# Patient Record
Sex: Male | Born: 1960
Health system: Southern US, Community
[De-identification: ages and names within clinical notes are randomized; demographics above are authoritative.]

## PROBLEM LIST (undated history)

## (undated) DIAGNOSIS — G4730 Sleep apnea, unspecified: Secondary | ICD-10-CM

## (undated) DIAGNOSIS — D369 Benign neoplasm, unspecified site: Secondary | ICD-10-CM

## (undated) DIAGNOSIS — G4733 Obstructive sleep apnea (adult) (pediatric): Secondary | ICD-10-CM

## (undated) DIAGNOSIS — E7800 Pure hypercholesterolemia, unspecified: Secondary | ICD-10-CM

## (undated) DIAGNOSIS — K219 Gastro-esophageal reflux disease without esophagitis: Secondary | ICD-10-CM

## (undated) HISTORY — PX: POLYPECTOMY: SHX149

## (undated) HISTORY — DX: Obstructive sleep apnea (adult) (pediatric): G47.33

## (undated) HISTORY — PX: COLONOSCOPY: SHX174

## (undated) HISTORY — PX: MOHS SURGERY: SHX181

## (undated) HISTORY — DX: Sleep apnea, unspecified: G47.30

## (undated) HISTORY — DX: Pure hypercholesterolemia, unspecified: E78.00

## (undated) HISTORY — PX: ESOPHAGEAL DILATION: SHX303

## (undated) HISTORY — PX: INGUINAL HERNIA REPAIR: SUR1180

## (undated) HISTORY — DX: Benign neoplasm, unspecified site: D36.9

## (undated) HISTORY — DX: Gastro-esophageal reflux disease without esophagitis: K21.9

---

## 2000-01-02 ENCOUNTER — Emergency Department (HOSPITAL_COMMUNITY): Admission: EM | Admit: 2000-01-02 | Discharge: 2000-01-02 | Payer: Self-pay | Admitting: *Deleted

## 2006-10-18 ENCOUNTER — Ambulatory Visit (HOSPITAL_COMMUNITY): Admission: RE | Admit: 2006-10-18 | Discharge: 2006-10-18 | Payer: Self-pay | Admitting: *Deleted

## 2007-03-02 ENCOUNTER — Ambulatory Visit (HOSPITAL_BASED_OUTPATIENT_CLINIC_OR_DEPARTMENT_OTHER): Admission: RE | Admit: 2007-03-02 | Discharge: 2007-03-02 | Payer: Self-pay | Admitting: Family Medicine

## 2007-03-04 ENCOUNTER — Ambulatory Visit: Payer: Self-pay | Admitting: Internal Medicine

## 2010-11-10 NOTE — Procedures (Signed)
NAME:  Norman Dunn, Norman Dunn NO.:  [id number]   MEDICAL RECORD NO.:  [id number]          PATIENT TYPE:  OUT   LOCATION:  SLEEP CENTER                 FACILITY:  [facility]   PHYSICIAN:  Clinton D. Maple Hudson, MD, FCCP, FACPDATE OF BIRTH:  1960/09/22   DATE OF STUDY:  03/02/2007                            NOCTURNAL POLYSOMNOGRAM   REFERRING PHYSICIAN:  Tammy R. Collins Scotland, M.D.   INDICATIONS FOR PROCEDURE:  Hypersomnia with sleep apnea.   RESULTS:  Epward sleepiness score 8/24; BMI 28.8. Weight 202 pounds. No  home medications are listed.   RESPIRATORY DATA:  Total sleep time 372 minutes with sleep efficiency  90%. Stage 1 was 6%. Stage 2, 75%. Stage 3 absent. REM 19% of total  sleep time. Sleep latency 11 minutes. REM latency 75 minutes. Awake  after sleep onset, 28 minutes. Arousal index 10.8. No bedtime medication  was taken.   RESPIRATORY DATA:  Split study protocol. Apnea hypopnea index (AHI RDI)  38.6 obstructive events per hour, indicating moderate obstructive sleep  apnea/hypopnea syndrome before CPAP. There were 9 obstructive apnea's  and 119 hypopnea's before CPAP. All sleep and all events were while  supine. REM AHI 49.5. CPAP was titrated to 14 CWP, AHI zero per hour. A  large ResMed Quattro mask was chosen. The patient's tolerance was poor.   OXYGEN DATA:  Moderate snoring with oxygen desaturation nadir 82%. After  CPAP control, saturation held 95% to 98% on room air.   CARDIAC DATA:  Normal sinus rhythm.   MOVEMENT/PARASOMNIA:  No significant limb jerk or movement disturbance.   IMPRESSION/RECOMMENDATIONS:  1. Moderate obstructive sleep apnea/hypopnea syndrome, AHI 38.6 per      hour with all sleep and events while supine. Moderate snoring with      oxygen desaturation nadir 82%.  2. Successful CPAP titration to 14 CWP. AHI zero per hour. A large      ResMed Quattro mask was chosen with heated humidifier. Technician      report patient did not like CPAP,  considering in cumbersome. He may      be able      to adjust successfully with time and some support and education.      Otherwise, may need to consider alternative therapies, although      CPAP did work well during this study.      Clinton D. Maple Hudson, MD, [facility], FACP  Diplomate, Biomedical engineer of Sleep Medicine  Electronically Signed     CDY/MEDQ  D:  03/05/2007 09:55:47  T:  03/05/2007 17:12:42  Job:  [id number]

## 2010-11-13 NOTE — Op Note (Signed)
NAME:  Norman Dunn, Norman Dunn                 ACCOUNT NO.:  [id number]   MEDICAL RECORD NO.:  [id number]          PATIENT TYPE:  AMB   LOCATION:  DAY                          FACILITY:  [facility]   PHYSICIAN:  Alfonse Ras, MD   DATE OF BIRTH:  [birth year]   DATE OF PROCEDURE:  10/18/2006  DATE OF DISCHARGE:                               OPERATIVE REPORT   PREOPERATIVE DIAGNOSIS:  Right inguinal hernia.   POSTOPERATIVE DIAGNOSIS:  Right inguinal hernia.   PROCEDURE:  Right inguinal hernia repair with mesh.   ANESTHESIA:  General, laryngeal mask.   SURGEON:  Alfonse Ras, M.D.   DESCRIPTION:  The patient was taken to the operating room and placed in  the supine position. After adequate general anesthesia was induced using  laryngeal mask, the abdomen and perineum were prepped and draped in a  normal sterile fashion. Using an oblique incision over the inguinal  canal, I dissected down onto the external oblique fascia.  This was  opened along its fibers down to the pubic tubercle.  The ilioinguinal  nerve was retracted superiorly.  The spermatic cord was surrounded at  the pubic tubercle.  A Penrose drain was placed.  A direct hernia defect  was identified and reduced.  On skeletonizing the cord, a small indirect  hernia defect was identified, as well, dissected off the cord, and  ligated at the internal ring.  Primary repair was performed of the floor  of Hesselbach's triangle by approximating the shelving edge of Cooper's  ligament and inguinal ligament to the transversalis fascia.  A piece of  Atrium mesh was then placed over this and sutured using a running 2-0  Prolene from the pubic tubercle along the transversalis fascia along the  inguinal ligament.  It was split and brought out lateral to the internal  ring and tacked laterally, as well.  Adequate hemostasis was insured.  The tissues were injected with 0.5% Marcaine.  The external oblique  fascia was closed with running 3-0  Vicryl.  The skin was closed with  staples.  The patient tolerated the procedure well and went to PACU in  good condition.      Alfonse Ras, MD  Electronically Signed     KRE/MEDQ  D:  10/18/2006  T:  10/18/2006  Job:  [id number]

## 2010-11-15 ENCOUNTER — Emergency Department (HOSPITAL_COMMUNITY)
Admission: EM | Admit: 2010-11-15 | Discharge: 2010-11-15 | Disposition: A | Discharge status: Home / Self Care | Payer: Managed Care, Other (non HMO) | Attending: Emergency Medicine | Admitting: Emergency Medicine

## 2010-11-15 ENCOUNTER — Emergency Department (HOSPITAL_COMMUNITY): Payer: Managed Care, Other (non HMO)

## 2010-11-15 DIAGNOSIS — IMO0002 Reserved for concepts with insufficient information to code with codable children: Secondary | ICD-10-CM | POA: Insufficient documentation

## 2010-11-15 DIAGNOSIS — S022XXA Fracture of nasal bones, initial encounter for closed fracture: Secondary | ICD-10-CM | POA: Insufficient documentation

## 2010-11-15 DIAGNOSIS — S0083XA Contusion of other part of head, initial encounter: Secondary | ICD-10-CM | POA: Insufficient documentation

## 2010-11-15 DIAGNOSIS — S0003XA Contusion of scalp, initial encounter: Secondary | ICD-10-CM | POA: Insufficient documentation

## 2010-11-15 DIAGNOSIS — S139XXA Sprain of joints and ligaments of unspecified parts of neck, initial encounter: Secondary | ICD-10-CM | POA: Insufficient documentation

## 2012-01-13 ENCOUNTER — Encounter: Payer: Self-pay | Admitting: Gastroenterology

## 2012-02-08 ENCOUNTER — Ambulatory Visit (AMBULATORY_SURGERY_CENTER): Payer: Managed Care, Other (non HMO)

## 2012-02-08 VITALS — Ht 70.0 in | Wt 206.5 lb

## 2012-02-08 DIAGNOSIS — Z1211 Encounter for screening for malignant neoplasm of colon: Secondary | ICD-10-CM

## 2012-02-08 MED ORDER — MOVIPREP 100 G PO SOLR: ORAL | Status: DC

## 2012-02-09 ENCOUNTER — Encounter: Payer: Self-pay | Admitting: Gastroenterology

## 2012-02-23 ENCOUNTER — Ambulatory Visit (AMBULATORY_SURGERY_CENTER): Payer: Managed Care, Other (non HMO) | Admitting: Gastroenterology

## 2012-02-23 ENCOUNTER — Encounter: Payer: Self-pay | Admitting: Gastroenterology

## 2012-02-23 VITALS — BP 122/74 | HR 54 | Temp 98.0°F | Resp 18 | Ht 70.0 in | Wt 206.0 lb

## 2012-02-23 DIAGNOSIS — Z1211 Encounter for screening for malignant neoplasm of colon: Secondary | ICD-10-CM

## 2012-02-23 DIAGNOSIS — D126 Benign neoplasm of colon, unspecified: Secondary | ICD-10-CM

## 2012-02-23 DIAGNOSIS — K5730 Diverticulosis of large intestine without perforation or abscess without bleeding: Secondary | ICD-10-CM

## 2012-02-23 MED ORDER — SODIUM CHLORIDE 0.9 % IV SOLN: 500.0000 mL | INTRAVENOUS | Status: DC

## 2012-02-23 NOTE — Progress Notes (Signed)
Bradycardic baseline 40's-50's, during scope insertion hr down to 30's

## 2012-02-23 NOTE — Patient Instructions (Signed)
Colon polyps x2 removed and diverticulosis seen. See handouts given. Repeat colonoscopy in 6 months, result letter in mail in 1-2 weeks. Hold aspirin, aspirin products,and anti-inflammatory medications for 2 weeks(until 03/08/12). Resume current medications. Call us with any questions or concerns. Thank you!!  YOU HAD AN ENDOSCOPIC PROCEDURE TODAY AT THE [facility]: Refer to the procedure report that was given to you for any specific questions about what was found during the examination.  If the procedure report does not answer your questions, please call your gastroenterologist to clarify.  If you requested that your care partner not be given the details of your procedure findings, then the procedure report has been included in a sealed envelope for you to review at your convenience later.  YOU SHOULD EXPECT: Some feelings of bloating in the abdomen. Passage of more gas than usual.  Walking can help get rid of the air that was put into your GI tract during the procedure and reduce the bloating. If you had a lower endoscopy (such as a colonoscopy or flexible sigmoidoscopy) you may notice spotting of blood in your stool or on the toilet paper. If you underwent a bowel prep for your procedure, then you may not have a normal bowel movement for a few days.  DIET: Your first meal following the procedure should be a light meal and then it is ok to progress to your normal diet.  A half-sandwich or bowl of soup is an example of a good first meal.  Heavy or fried foods are harder to digest and may make you feel nauseous or bloated.  Likewise meals heavy in dairy and vegetables can cause extra gas to form and this can also increase the bloating.  Drink plenty of fluids but you should avoid alcoholic beverages for 24 hours.  ACTIVITY: Your care partner should take you home directly after the procedure.  You should plan to take it easy, moving slowly for the rest of the day.  You can resume normal activity  the day after the procedure however you should NOT DRIVE or use heavy machinery for 24 hours (because of the sedation medicines used during the test).    SYMPTOMS TO REPORT IMMEDIATELY: A gastroenterologist can be reached at any hour.  During normal business hours, 8:30 AM to 5:00 PM Monday through Friday, call [phone].  After hours and on weekends, please call the GI answering service at [phone] who will take a message and have the physician on call contact you.   Following lower endoscopy (colonoscopy or flexible sigmoidoscopy):  Excessive amounts of blood in the stool  Significant tenderness or worsening of abdominal pains  Swelling of the abdomen that is new, acute  Fever of 100F or higher  FOLLOW UP: If any biopsies were taken you will be contacted by phone or by letter within the next 1-3 weeks.  Call your gastroenterologist if you have not heard about the biopsies in 3 weeks.  Our staff will call the home number listed on your records the next business day following your procedure to check on you and address any questions or concerns that you may have at that time regarding the information given to you following your procedure. This is a courtesy call and so if there is no answer at the home number and we have not heard from you through the emergency physician on call, we will assume that you have returned to your regular daily activities without incident.  SIGNATURES/CONFIDENTIALITY: You and/or your  care partner have signed paperwork which will be entered into your electronic medical record.  These signatures attest to the fact that that the information above on your After Visit Summary has been reviewed and is understood.  Full responsibility of the confidentiality of this discharge information lies with you and/or your care-partner.

## 2012-02-23 NOTE — Progress Notes (Signed)
Patient did not experience any of the following events: a burn prior to discharge; a fall within the facility; wrong site/side/patient/procedure/implant event; or a hospital transfer or hospital admission upon discharge from the facility. (G8907) Patient did not have preoperative order for IV antibiotic SSI prophylaxis. (G8918)  

## 2012-02-23 NOTE — Op Note (Signed)
[facility] [address] [place], [address]   COLONOSCOPY PROCEDURE REPORT PATIENT: Norman Dunn, Norman Dunn  MR#: [id number] BIRTHDATE: January 18, 1961 , 51  yrs. old GENDER: Male ENDOSCOPIST: Rachael Fee, MD REFERRED XB:JYNWG Spear, M.D. PROCEDURE DATE:  02/23/2012 PROCEDURE:   Colonoscopy with snare polypectomy ASA CLASS:   Class II INDICATIONS:average risk screening. MEDICATIONS: Fentanyl 50 mcg IV, Versed 6 mg IV, and These medications were titrated to patient response per physician's verbal order DESCRIPTION OF PROCEDURE:   After the risks benefits and alternatives of the procedure were thoroughly explained, informed consent was obtained.  A digital rectal exam revealed no rectal mass.   The LB PCF-H180AL [id number]  endoscope was introduced through the anus and advanced to the cecum, which was identified by both the appendix and ileocecal valve. No adverse events experienced. The quality of the prep was good, using MoviPrep  The instrument was then slowly withdrawn as the colon was fully examined.   COLON FINDINGS: Two sessile polyps were found, removed and sent to pathology.  One waas 13mm across, located on fold at splenic flexure, removed in piecemeal fashion using snare/cautery and cold snare as well.  This was sent to path (jar 1).  The other was in descending colon, 3mm across, removed with cold snare, sent to pathology (jar 2).   Mild diverticulosis was noted The finding was in the left colon.   The colon mucosa was otherwise normal. Retroflexed views revealed no abnormalities. The time to cecum=3 minutes 46 seconds.  Withdrawal time=17 minutes 43 seconds.  The scope was withdrawn and the procedure completed. COMPLICATIONS: There were no complications.  ENDOSCOPIC IMPRESSION: 1.   Two polyps found, removed and sent to pathology (one was removed in piecemeal fashion)). 2.   Mild diverticulosis was noted in the left colon 3.   The colon mucosa was otherwise  normal  RECOMMENDATIONS: If the polyp(s) removed today are proven to be adenomatous (pre-cancerous) polyps, you will need a colonoscopy in 6 months given piecemeal resection.  Otherwise you should continue to follow colorectal cancer screening guidelines for "routine risk" patients with a colonoscopy in 10 years.  You will receive a letter within 1-2 weeks with the results of your biopsy as well as final recommendations.  Please call my office if you have not received a letter after 3 weeks.    eSigned:  Rachael Fee, MD 02/23/2012 10:42 AM

## 2012-02-24 ENCOUNTER — Telehealth: Payer: Self-pay | Admitting: *Deleted

## 2012-02-24 NOTE — Telephone Encounter (Signed)
  Follow up Call-  Call back number 02/23/2012  Post procedure Call Back phone  # [phone]  Permission to leave phone message Yes     Patient questions:  Do you have a fever, pain , or abdominal swelling? no Pain Score  0 *  Have you tolerated food without any problems? yes  Have you been able to return to your normal activities? yes  Do you have any questions about your discharge instructions: Diet   no Medications  no Follow up visit  no  Do you have questions or concerns about your Care? no  Actions: * If pain score is 4 or above: No action needed, pain <4.

## 2012-03-04 ENCOUNTER — Encounter: Payer: Self-pay | Admitting: Gastroenterology

## 2012-03-06 ENCOUNTER — Telehealth: Payer: Self-pay | Admitting: Gastroenterology

## 2012-03-06 NOTE — Telephone Encounter (Signed)
Left message on machine to call back regarding letter about biopsy result

## 2012-03-07 NOTE — Telephone Encounter (Signed)
Pt has been given the biopsy results and was advised that the letter with all this info has been mailed

## 2012-03-09 ENCOUNTER — Telehealth: Payer: Self-pay | Admitting: Gastroenterology

## 2012-03-09 NOTE — Telephone Encounter (Signed)
Pt aware letter has been mailed.  

## 2012-07-17 ENCOUNTER — Encounter: Payer: Self-pay | Admitting: Gastroenterology

## 2012-08-14 ENCOUNTER — Encounter: Payer: Self-pay | Admitting: Gastroenterology

## 2012-09-04 ENCOUNTER — Ambulatory Visit (AMBULATORY_SURGERY_CENTER): Payer: Managed Care, Other (non HMO) | Admitting: *Deleted

## 2012-09-04 VITALS — Ht 70.5 in | Wt 204.0 lb

## 2012-09-04 DIAGNOSIS — Z86010 Personal history of colonic polyps: Secondary | ICD-10-CM

## 2012-09-04 DIAGNOSIS — Z1211 Encounter for screening for malignant neoplasm of colon: Secondary | ICD-10-CM

## 2012-09-04 MED ORDER — NA SULFATE-K SULFATE-MG SULF 17.5-3.13-1.6 GM/177ML PO SOLN: ORAL | Status: DC

## 2012-09-05 ENCOUNTER — Encounter: Payer: Self-pay | Admitting: Gastroenterology

## 2012-09-07 ENCOUNTER — Encounter: Payer: Self-pay | Admitting: Pulmonary Disease

## 2012-09-07 ENCOUNTER — Ambulatory Visit (INDEPENDENT_AMBULATORY_CARE_PROVIDER_SITE_OTHER): Payer: Managed Care, Other (non HMO) | Admitting: Pulmonary Disease

## 2012-09-07 VITALS — BP 120/82 | HR 67 | Temp 97.4°F | Ht 70.0 in | Wt 202.0 lb

## 2012-09-07 DIAGNOSIS — G4733 Obstructive sleep apnea (adult) (pediatric): Secondary | ICD-10-CM

## 2012-09-07 NOTE — Patient Instructions (Signed)
Will have [facility] adjust your CPAP settings Follow up in 4 weeks

## 2012-09-07 NOTE — Assessment & Plan Note (Signed)
He has history of severe OSA.  He has history of hypertension.  He continues to have sleep disruption.  His main problem has been adjusting to CPAP pressure setting.  I have reviewed his sleep test results with the patient.  Explained how sleep apnea can affect the patient's health.  Driving precautions and importance of weight loss were discussed.  Treatment options for sleep apnea were reviewed.  Will have his DME change his CPAP to auto setting with range of 5 to 15 cm H2O, and review his download after this change.  Explained he may need repeat sleep study and possibly in lab titration study.  If he is unable to tolerate CPAP, then he may be a good candidate for an oral appliance.

## 2012-09-07 NOTE — Progress Notes (Signed)
Chief Complaint  Patient presents with  . Advice Only    had sleep study 2008. has CPAP. will occasionally use it. feels like pressure is to strong    History of Present Illness: Norman Dunn is a 52 y.o. male for evaluation of sleep apnea.  He has trouble with snoring, and his wife reported that he would stop breathing while asleep.  As a result he had a sleep study in 2008.  This showed severe sleep apnea.  He was started on CPAP 14 cm H2O.  He does not think his pressure setting has been changed since.  He continues to try using CPAP, but he has difficulty tolerating pressure from machine.  He has both a full face mask and hybrid mask >> these fit well.    He goes to bed at 1130 pm.  He falls asleep quickly.  He is a restless sleeper.  His wife has to nudge him to breath.  He gets out of bed at 6 am.  He feels okay in the morning, but has to take a 30 minute nap in the evening.  He does not use anything to help him stay awake or fall asleep.  The patient denies sleep walking, sleep talking, bruxism, or nightmares.  There is no history of restless legs.  The patient denies sleep hallucinations, sleep paralysis, or cataplexy.  His Epworth score is 8 out of 24.  Tests: PSG 03/02/07 >> AHI 38.6, SpO2 low 82%; CPAP 14 cm H2O >> AHI 0  AVEL OGAWA  has a past medical history of GERD (gastroesophageal reflux disease); High cholesterol; Hypertension; and OSA (obstructive sleep apnea).  TARIK TEIXEIRA  has past surgical history that includes Inguinal hernia repair.  Prior to Admission medications   Medication Sig Start Date End Date Taking? Authorizing Provider  omeprazole (PRILOSEC) 20 MG capsule Take 20 mg by mouth daily.   Yes Historical Provider, MD  Na Sulfate-K Sulfate-Mg Sulf SOLN SuPrep-take as directed. 09/04/12   Rachael Fee, MD    No Known Allergies  family history includes Prostate cancer in his father.  There is no history of Colon cancer.   reports that he has never  smoked. He has never used smokeless tobacco. He reports that  drinks alcohol. He reports that he does not use illicit drugs.  Review of Systems  Constitutional: Negative for fever, appetite change and unexpected weight change.  HENT: Negative for ear pain, congestion, sore throat, sneezing, trouble swallowing and dental problem.   Respiratory: Negative for cough and shortness of breath.   Cardiovascular: Negative for chest pain, palpitations and leg swelling.  Gastrointestinal: Negative for abdominal pain.  Skin: Negative for rash.  Neurological: Negative for headaches.  Psychiatric/Behavioral: Negative for dysphoric mood. The patient is not nervous/anxious.    Physical Exam:  General - No distress ENT - No sinus tenderness, no oral exudate, MP 3,no LAN, no thyromegaly, TM clear, pupils equal/reactive Cardiac - s1s2 regular, no murmur, pulses symmetric Chest - No wheeze/rales/dullness, good air entry, normal respiratory excursion Back - No focal tenderness Abd - Soft, non-tender, no organomegaly, + bowel sounds Ext - No edema Neuro - Normal strength, cranial nerves intact Skin - No rashes Psych - Normal mood, and behavior  Assessment/Plan:  Coralyn Helling, MD [place] [facility]/Sleep Pager:  [phone] 09/07/2012, 4:47 PM

## 2012-09-07 NOTE — Progress Notes (Deleted)
  Subjective:    Patient ID: Norman Dunn, male    DOB: 11-14-1960, 52 y.o.   MRN: [id number]  HPI    Review of Systems  Constitutional: Negative for fever, appetite change and unexpected weight change.  HENT: Negative for ear pain, congestion, sore throat, sneezing, trouble swallowing and dental problem.   Respiratory: Negative for cough and shortness of breath.   Cardiovascular: Negative for chest pain, palpitations and leg swelling.  Gastrointestinal: Negative for abdominal pain.  Skin: Negative for rash.  Neurological: Negative for headaches.  Psychiatric/Behavioral: Negative for dysphoric mood. The patient is not nervous/anxious.        Objective:   Physical Exam        Assessment & Plan:

## 2012-09-18 ENCOUNTER — Encounter: Payer: Self-pay | Admitting: Gastroenterology

## 2012-09-18 ENCOUNTER — Ambulatory Visit (AMBULATORY_SURGERY_CENTER): Payer: Managed Care, Other (non HMO) | Admitting: Gastroenterology

## 2012-09-18 VITALS — BP 112/61 | HR 51 | Temp 95.9°F | Resp 15 | Ht 70.5 in | Wt 204.0 lb

## 2012-09-18 DIAGNOSIS — K5730 Diverticulosis of large intestine without perforation or abscess without bleeding: Secondary | ICD-10-CM

## 2012-09-18 DIAGNOSIS — Z86010 Personal history of colonic polyps: Secondary | ICD-10-CM

## 2012-09-18 DIAGNOSIS — Z1211 Encounter for screening for malignant neoplasm of colon: Secondary | ICD-10-CM

## 2012-09-18 DIAGNOSIS — D126 Benign neoplasm of colon, unspecified: Secondary | ICD-10-CM

## 2012-09-18 MED ORDER — SODIUM CHLORIDE 0.9 % IV SOLN: 500.0000 mL | INTRAVENOUS | Status: DC

## 2012-09-18 NOTE — Progress Notes (Signed)
Patient did not experience any of the following events: a burn prior to discharge; a fall within the facility; wrong site/side/patient/procedure/implant event; or a hospital transfer or hospital admission upon discharge from the facility. (G8907)Patient did not have preoperative order for IV antibiotic SSI prophylaxis. [phone])  Pt. Had slight redness at IV site and redness from taped area.  No swelling at IV site.  Pt. Denies discomfort.

## 2012-09-18 NOTE — Progress Notes (Signed)
[id number] entries deleted, wrong patient charted on.

## 2012-09-18 NOTE — Patient Instructions (Addendum)

## 2012-09-18 NOTE — Progress Notes (Signed)
Pt. Attached to monitors throughout recovery phase.  Bradycardic 48-56 range.  BP stable with normal O2 saturations.

## 2012-09-18 NOTE — Op Note (Signed)
[facility] [address] [place], [address]   COLONOSCOPY PROCEDURE REPORT  PATIENT: Norman Dunn, Norman Dunn  MR#: [id number] BIRTHDATE: 17-Aug-1960 , 51  yrs. old GENDER: Male ENDOSCOPIST: Rachael Fee, MD PROCEDURE DATE:  09/18/2012 PROCEDURE:   Colonoscopy with snare polypectomy ASA CLASS:   Class II INDICATIONS:1.3cm TA at splenic flexure, piecemeal resected 01/2012.  MEDICATIONS: Fentanyl 75 mcg IV, Versed 8 mg IV, and These medications were titrated to patient response per physician's verbal order  DESCRIPTION OF PROCEDURE:   After the risks benefits and alternatives of the procedure were thoroughly explained, informed consent was obtained.  A digital rectal exam revealed no abnormalities of the rectum.   The LB PCF-H180AL [id number]  endoscope was introduced through the anus and advanced to the cecum, which was identified by both the appendix and ileocecal valve. No adverse events experienced.   The quality of the prep was good.  The instrument was then slowly withdrawn as the colon was fully examined.   COLON FINDINGS: One small polyp was found, removed and sent to pathology.  This was at splenic flexure (site of 2013 polyp removal), was 3-36mm across, removed with cold snare, sessile. There were a few small diverticulum in left colon.  The examination was otherwise normal.  Retroflexed views revealed no abnormalities. The time to cecum=3 minutes 06 seconds.  Withdrawal time=7 minutes 11 seconds.  The scope was withdrawn and the procedure completed. COMPLICATIONS: There were no complications.  ENDOSCOPIC IMPRESSION: One small polyp was found, removed and sent to pathology. There were a few small diverticulum in left colon. The examination was otherwise normal.  RECOMMENDATIONS: Await final pathology report, you will likely need repeat colonoscopy in 3 years.  You will receive a letter within 1-2 weeks with the results of your biopsy as well as final  recommendations. Please call my office if you have not received a letter after 3 weeks.   eSigned:  Rachael Fee, MD 09/18/2012 11:07 AM

## 2012-09-19 ENCOUNTER — Telehealth: Payer: Self-pay | Admitting: *Deleted

## 2012-09-19 NOTE — Telephone Encounter (Signed)
No answer, left message to call office if questions or concerns. 

## 2012-09-26 ENCOUNTER — Encounter: Payer: Self-pay | Admitting: Gastroenterology

## 2012-10-11 ENCOUNTER — Ambulatory Visit (INDEPENDENT_AMBULATORY_CARE_PROVIDER_SITE_OTHER): Payer: Managed Care, Other (non HMO) | Admitting: Pulmonary Disease

## 2012-10-11 ENCOUNTER — Telehealth: Payer: Self-pay | Admitting: Pulmonary Disease

## 2012-10-11 ENCOUNTER — Encounter: Payer: Self-pay | Admitting: Pulmonary Disease

## 2012-10-11 VITALS — BP 122/80 | HR 60 | Temp 97.8°F | Ht 70.0 in | Wt 202.6 lb

## 2012-10-11 DIAGNOSIS — G4733 Obstructive sleep apnea (adult) (pediatric): Secondary | ICD-10-CM

## 2012-10-11 NOTE — Patient Instructions (Signed)
Will call with results of CPAP report Follow up in 6 months 

## 2012-10-11 NOTE — Progress Notes (Signed)
Chief Complaint  Patient presents with  . Follow-up    Pt wearing CPAP approx 3 times per week and for approx 4 hrs at a time-- pressures are tolerable but says mouth dries out    History of Present Illness: Norman Dunn is a 52 y.o. male with severe OSA.  He has been using CPAP.  This helps his snoring, and his wife reports he sleeps more soundly.  His main problem is with dry mouth. He has a full face mask and hybrid mask, and switches between the two.   TESTS: PSG 03/02/07 >> AHI 38.6, SpO2 low 82%; CPAP 14 cm H2O >> AHI 0  Norman Dunn  has a past medical history of GERD (gastroesophageal reflux disease); High cholesterol; Hypertension; and OSA (obstructive sleep apnea).  Norman Dunn  has past surgical history that includes Inguinal hernia repair.  Prior to Admission medications   Medication Sig Start Date End Date Taking? Authorizing Provider  omeprazole (PRILOSEC) 20 MG capsule Take 20 mg by mouth daily.    Historical Provider, MD    No Known Allergies   Physical Exam:  General - No distress ENT - No sinus tenderness, MP 3, no oral exudate, no LAN Cardiac - s1s2 regular, no murmur Chest - No wheeze/rales/dullness Back - No focal tenderness Abd - Soft, non-tender Ext - No edema Neuro - Normal strength Skin - No rashes Psych - normal mood, and behavior   Assessment/Plan:  Coralyn Helling, MD [place] [facility]/Critical Care/Sleep Pager:  [phone]

## 2012-10-11 NOTE — Telephone Encounter (Signed)
I spoke w/ pt.  Pt will bring card to this visit.  (Per [name]-ok to just bring card, not whole machine.).  Pt stated nothing further needed at this time.  Norman Dunn

## 2012-10-11 NOTE — Telephone Encounter (Signed)
Order sent on 3.13.14 to [facility] to change pt to auto CPAP w/ download in 2 weeks Sharyn Creamer, spoke with Shanda Bumps Per Shanda Bumps, they have ATC pt several times to ask him to mail in his card but has been unable to reach pt Pt reportedly has a [employer] machine Can bring machine in today to ov and we can download in the office  ATC pt on cell phone > no answer and was told that voice mail is full and unable to leave message; then the line went dead ATC pt at work number > line has been disconnected LMOM TCB x1 on home number

## 2012-10-12 NOTE — Assessment & Plan Note (Signed)
Will get his CPAP download, and then determine if he needs to have adjustment to his pressure settings to alleviate mouth dryness.  If he is unable to tolerate CPAP, then oral appliance may be a good option for him.

## 2013-04-10 ENCOUNTER — Telehealth: Payer: Self-pay | Admitting: Pulmonary Disease

## 2013-04-10 NOTE — Telephone Encounter (Signed)
Auto CPAP 02/21/13 to 03/22/13 >> Used on 27 of 30 nights with average 6 hrs 12 min.  Average AHI 6.4 with mean CPAP 10 cm H2O and 90 th percentile CPAP 13 cm H2O.  Will have my nurse inform pt that CPAP report looks good.  He needs ROV to review status of CPAP therapy.

## 2013-04-10 NOTE — Telephone Encounter (Signed)
Pt is aware of download results. Will contact pt at a later date to schedule appointment per his request. Recall will be placed.

## 2013-07-12 ENCOUNTER — Encounter: Payer: Self-pay | Admitting: Pulmonary Disease

## 2013-07-12 ENCOUNTER — Ambulatory Visit (INDEPENDENT_AMBULATORY_CARE_PROVIDER_SITE_OTHER): Payer: Managed Care, Other (non HMO) | Admitting: Pulmonary Disease

## 2013-07-12 VITALS — BP 108/60 | HR 61 | Temp 97.0°F | Ht 70.5 in | Wt 203.0 lb

## 2013-07-12 DIAGNOSIS — G4733 Obstructive sleep apnea (adult) (pediatric): Secondary | ICD-10-CM

## 2013-07-12 DIAGNOSIS — K219 Gastro-esophageal reflux disease without esophagitis: Secondary | ICD-10-CM

## 2013-07-12 MED ORDER — OMEPRAZOLE 20 MG PO CPDR: 20.0000 mg | DELAYED_RELEASE_CAPSULE | Freq: Every day | ORAL | Status: DC

## 2013-07-12 NOTE — Patient Instructions (Signed)
Follow up in 1 year.

## 2013-07-12 NOTE — Assessment & Plan Note (Signed)
He is compliant with CPAP and reports benefit.  Emphasized the importance of using CPAP whenever he is asleep.  If he has difficulty in the future tolerating CPAP, then he may be good candidate for oral appliance.

## 2013-07-12 NOTE — Progress Notes (Signed)
Chief Complaint  Patient presents with  . Follow-up    Wears CPAP 3-4 night a week at 6-7 hrs /night - Mask fits good    History of Present Illness: Norman Dunn is a 53 y.o. male with severe OSA.  He uses his CPAP w/o difficulty several nights per week.  He sometimes feels like taking a break from using CPAP, and sleeps w/o it.    His mask fit is good.  He denies sinus congestion or mouth dryness.  TESTS: PSG 03/02/07 >> AHI 38.6, SpO2 low 82%; CPAP 14 cm H2O >> AHI 0 Auto CPAP 02/21/13 to 03/22/13 >> Used on 27 of 30 nights with average 6 hrs 12 min. Average AHI 6.4 with mean CPAP 10 cm H2O and 90 th percentile CPAP 13 cm H2O.  Norman Dunn  has a past medical history of GERD (gastroesophageal reflux disease); High cholesterol; Hypertension; and OSA (obstructive sleep apnea).  Norman Dunn  has past surgical history that includes Inguinal hernia repair.  Prior to Admission medications   Medication Sig Start Date End Date Taking? Authorizing Provider  omeprazole (PRILOSEC) 20 MG capsule Take 20 mg by mouth daily.    Historical Provider, MD    No Known Allergies   Physical Exam:  General - No distress ENT - No sinus tenderness, MP 3, no oral exudate, no LAN Cardiac - s1s2 regular, no murmur Chest - No wheeze/rales/dullness Back - No focal tenderness Abd - Soft, non-tender Ext - No edema Neuro - Normal strength Skin - No rashes Psych - normal mood, and behavior   Assessment/Plan:  Chesley Mires, MD [place] [facility]/Sleep Pager:  [phone]

## 2013-07-12 NOTE — Assessment & Plan Note (Signed)
Gave him script for prilosec.  Advised him to f/u with PCP in future for further management of his GERD.

## 2014-05-06 DIAGNOSIS — A6000 Herpesviral infection of urogenital system, unspecified: Secondary | ICD-10-CM | POA: Insufficient documentation

## 2015-02-27 ENCOUNTER — Ambulatory Visit (INDEPENDENT_AMBULATORY_CARE_PROVIDER_SITE_OTHER): Payer: Managed Care, Other (non HMO) | Admitting: Pulmonary Disease

## 2015-02-27 ENCOUNTER — Encounter: Payer: Self-pay | Admitting: Pulmonary Disease

## 2015-02-27 VITALS — BP 126/86 | HR 61 | Temp 98.3°F | Ht 70.0 in | Wt 206.8 lb

## 2015-02-27 DIAGNOSIS — G4733 Obstructive sleep apnea (adult) (pediatric): Secondary | ICD-10-CM

## 2015-02-27 DIAGNOSIS — Z9989 Dependence on other enabling machines and devices: Principal | ICD-10-CM

## 2015-02-27 MED ORDER — OMEPRAZOLE 20 MG PO CPDR: 20.0000 mg | DELAYED_RELEASE_CAPSULE | Freq: Every day | ORAL | Status: DC

## 2015-02-27 NOTE — Progress Notes (Signed)
Chief Complaint  Patient presents with  . Yearly Follow up    Wearing cpap approx once weekly for approx 5 hours.  Mask is uncomfortable.      History of Present Illness: Norman Dunn is a 54 y.o. male with severe OSA.  He feels that CPAP helps his sleep.  His main issue is with mask discomfort.  He also feels like pressure gets too high at times.  He has full face mask.  He uses [employer].  He has not received new mask for some time.  TESTS: PSG 03/02/07 >> AHI 38.6, SpO2 low 82%; CPAP 14 cm H2O >> AHI 0 Auto CPAP 02/21/13 to 03/22/13 >> Used on 27 of 30 nights with average 6 hrs 12 min. Average AHI 6.4 with mean CPAP 10 cm H2O and 90 th percentile CPAP 13 cm H2O.  PMHx >> GERD, HLD, HTN  PSHx, Medications, Allergies, Fhx, Shx reviewed.  Physical Exam: BP 126/86 mmHg  Pulse 61  Temp(Src) 98.3 F (36.8 C) (Oral)  Ht 5\' 10"  (1.778 m)  Wt 206 lb 12.8 oz (93.804 kg)  BMI 29.67 kg/m2  SpO2 98%  General - No distress ENT - No sinus tenderness, MP 3, no oral exudate, no LAN Cardiac - s1s2 regular, no murmur Chest - No wheeze/rales/dullness Back - No focal tenderness Abd - Soft, non-tender Ext - No edema Neuro - Normal strength Skin - No rashes Psych - normal mood, and behavior   Assessment/Plan:  Obstructive sleep apnea. He reports benefit from therapy, and compliance. Plan: - will arrange for new CPAP mask - will get copy of his download and determine if he can get away with lower pressure setting  GERD. Plan: - I have refilled his script for prilosec - advised he will need to f/u with his PCP in future for medication refills   Chesley Mires, MD [place] [facility]Critical [facility]/Sleep Pager:  [phone]

## 2015-02-27 NOTE — Patient Instructions (Signed)
Can check following web sites for CPAP mask options: [employer], [employer], [employer], [employer]  Will call with results of CPAP download  Follow up in 1 year

## 2015-03-27 ENCOUNTER — Telehealth: Payer: Self-pay | Admitting: Pulmonary Disease

## 2015-03-27 NOTE — Telephone Encounter (Signed)
lmtcb

## 2015-03-27 NOTE — Telephone Encounter (Signed)
Auto CPAP 02/01/15 to 03/02/15 >> used on 23 of 30 nights with average 3 hrs and 12 min.  Average AHI is 6.8 with median CPAP 10 cm H2O and 95 th percentile CPAP 13 cm H20.   Will have my nurse inform pt that CPAP settings seem adequate based on review of his download.  I do not recommend making any changes to current settings.

## 2015-03-28 ENCOUNTER — Telehealth: Payer: Self-pay | Admitting: Pulmonary Disease

## 2015-03-28 NOTE — Telephone Encounter (Signed)
Pt called office and was informed of reading from Dr Elsworth Soho for CPAP    Expand All Collapse All   Auto CPAP 02/01/15 to 03/02/15 >> used on 23 of 30 nights with average 3 hrs and 12 min. Average AHI is 6.8 with median CPAP 10 cm H2O and 95 th percentile CPAP 13 cm H20.   Will have my nurse inform pt that CPAP settings seem adequate based on review of his download. I do not recommend making any changes to current settings       Pt voiced understanding of this Will forward to Sharyn Lull so she knows that I spoke to pt

## 2015-03-28 NOTE — Telephone Encounter (Signed)
This is not Dr. Bari Mantis patient, this is Dr. Juanetta Gosling patient.

## 2015-04-01 NOTE — Telephone Encounter (Signed)
lmomtcb x 2  

## 2015-04-04 NOTE — Telephone Encounter (Signed)
lmtcb for pt.  

## 2015-04-04 NOTE — Telephone Encounter (Signed)
Spoke with pt, aware of results and recs.  Nothing further needed.  

## 2015-04-04 NOTE — Telephone Encounter (Signed)
Pt returning call [phone].Norman Dunn

## 2015-09-18 ENCOUNTER — Encounter: Payer: Self-pay | Admitting: Gastroenterology

## 2015-12-19 ENCOUNTER — Encounter: Payer: Self-pay | Admitting: Gastroenterology

## 2016-02-09 ENCOUNTER — Encounter: Payer: Self-pay | Admitting: Gastroenterology

## 2016-02-09 ENCOUNTER — Ambulatory Visit (AMBULATORY_SURGERY_CENTER): Payer: Self-pay | Admitting: *Deleted

## 2016-02-09 VITALS — Ht 70.5 in | Wt 199.0 lb

## 2016-02-09 DIAGNOSIS — Z86010 Personal history of colonic polyps: Secondary | ICD-10-CM

## 2016-02-09 MED ORDER — NA SULFATE-K SULFATE-MG SULF 17.5-3.13-1.6 GM/177ML PO SOLN: 1.0000 | Freq: Once | ORAL | 0 refills | Status: AC

## 2016-02-09 NOTE — Progress Notes (Signed)
No egg or soy allergy known to patient  No issues with past sedation with any surgeries  or procedures, no intubation problems  No diet pills per patient No home 02 use per patient  No blood thinners per patient  Pt denies issues with constipation   

## 2016-02-23 ENCOUNTER — Encounter: Payer: Self-pay | Admitting: Gastroenterology

## 2016-02-23 ENCOUNTER — Ambulatory Visit (AMBULATORY_SURGERY_CENTER): Payer: Managed Care, Other (non HMO) | Admitting: Gastroenterology

## 2016-02-23 VITALS — BP 103/75 | HR 51 | Temp 97.5°F | Resp 17 | Ht 70.0 in | Wt 199.0 lb

## 2016-02-23 DIAGNOSIS — D128 Benign neoplasm of rectum: Secondary | ICD-10-CM | POA: Diagnosis not present

## 2016-02-23 DIAGNOSIS — D123 Benign neoplasm of transverse colon: Secondary | ICD-10-CM

## 2016-02-23 DIAGNOSIS — Z86010 Personal history of colonic polyps: Secondary | ICD-10-CM | POA: Diagnosis not present

## 2016-02-23 MED ORDER — SODIUM CHLORIDE 0.9 % IV SOLN: 500.0000 mL | INTRAVENOUS | Status: DC

## 2016-02-23 NOTE — Op Note (Signed)
Norman Dunn: Norman Dunn Procedure Date: 02/23/2016 10:54 AM MRN: [id number] Endoscopist: Milus Banister , MD Age: 55 Referring MD:  Date of Birth: [birth year] Gender: Male Account #: [id number] Procedure:                Colonoscopy Indications:              High risk colon cancer surveillance: Personal                            history of colonic polyps; Colonoscopy 2013 Dr.                            Ardis Hughs 1.3cm adenoma, piecemeal resected; repeat                            colonsocopy 6 months later small residual adenoma                            at site, removecd Medicines:                Monitored Anesthesia Care Procedure:                Pre-Anesthesia Assessment:                           - Prior to the procedure, a History and Physical                            was performed, and patient medications and                            allergies were reviewed. The patient's tolerance of                            previous anesthesia was also reviewed. The risks                            and benefits of the procedure and the sedation                            options and risks were discussed with the patient.                            All questions were answered, and informed consent                            was obtained. Prior Anticoagulants: The patient has                            taken no previous anticoagulant or antiplatelet                            agents. ASA Grade Assessment: II - A patient with  mild systemic disease. After reviewing the risks                            and benefits, the patient was deemed in                            satisfactory condition to undergo the procedure.                           After obtaining informed consent, the colonoscope                            was passed under direct vision. Throughout the                            procedure, the patient's blood pressure, pulse, and                           oxygen saturations were monitored continuously. The                            Model CF-HQ190L [id number]) scope was introduced                            through the anus and advanced to the the cecum,                            identified by appendiceal orifice and ileocecal                            valve. The colonoscopy was performed without                            difficulty. The patient tolerated the procedure                            well. The quality of the bowel preparation was                            excellent. The ileocecal valve, appendiceal                            orifice, and rectum were photographed. Scope In: 10:58:32 AM Scope Out: 11:08:16 AM Scope Withdrawal Time: 0 hours 7 minutes 20 seconds  Total Procedure Duration: 0 hours 9 minutes 44 seconds  Findings:                 Two sessile polyps were found in the rectum and                            transverse colon. The polyps were 2 to 4 mm in                            size. These polyps were removed with a cold snare.  Resection and retrieval were complete.                           The exam was otherwise without abnormality on                            direct and retroflexion views. Complications:            No immediate complications. Estimated blood loss:                            None. Estimated Blood Loss:     Estimated blood loss: none. Impression:               - Two 2 to 4 mm polyps in the rectum and in the                            transverse colon, removed with a cold snare.                            Resected and retrieved.                           - The examination was otherwise normal on direct                            and retroflexion views. Recommendation:           - Patient has a contact number available for                            emergencies. The signs and symptoms of potential                            delayed complications  were discussed with the                            patient. Return to normal activities tomorrow.                            Written discharge instructions were provided to the                            patient.                           - Resume previous diet.                           - Continue present medications.                           You will receive a letter within 2-3 weeks with the                            pathology results and my final recommendations.  If the polyp(s) is proven to be 'pre-cancerous' on                            pathology, you will need repeat colonoscopy in 5                            years. Milus Banister, MD 02/23/2016 11:11:37 AM This report has been signed electronically.

## 2016-02-23 NOTE — Progress Notes (Signed)
Called to room to assist during endoscopic procedure.  Patient ID and intended procedure confirmed with present staff. Received instructions for my participation in the procedure from the performing physician.  

## 2016-02-23 NOTE — Progress Notes (Signed)
Report given to PACU RN, vss 

## 2016-02-23 NOTE — Patient Instructions (Signed)
YOU HAD AN ENDOSCOPIC PROCEDURE TODAY AT THE [facility]:   Refer to the procedure report that was given to you for any specific questions about what was found during the examination.  If the procedure report does not answer your questions, please call your gastroenterologist to clarify.  If you requested that your care partner not be given the details of your procedure findings, then the procedure report has been included in a sealed envelope for you to review at your convenience later.  YOU SHOULD EXPECT: Some feelings of bloating in the abdomen. Passage of more gas than usual.  Walking can help get rid of the air that was put into your GI tract during the procedure and reduce the bloating. If you had a lower endoscopy (such as a colonoscopy or flexible sigmoidoscopy) you may notice spotting of blood in your stool or on the toilet paper. If you underwent a bowel prep for your procedure, you may not have a normal bowel movement for a few days.  Please Note:  You might notice some irritation and congestion in your nose or some drainage.  This is from the oxygen used during your procedure.  There is no need for concern and it should clear up in a day or so.  SYMPTOMS TO REPORT IMMEDIATELY:   Following lower endoscopy (colonoscopy or flexible sigmoidoscopy):  Excessive amounts of blood in the stool  Significant tenderness or worsening of abdominal pains  Swelling of the abdomen that is new, acute  Fever of 100F or higher    For urgent or emergent issues, a gastroenterologist can be reached at any hour by calling [phone].   DIET:  We do recommend a small meal at first, but then you may proceed to your regular diet.  Drink plenty of fluids but you should avoid alcoholic beverages for 24 hours.  ACTIVITY:  You should plan to take it easy for the rest of today and you should NOT DRIVE or use heavy machinery until tomorrow (because of the sedation medicines used during the test).     FOLLOW UP: Our staff will call the number listed on your records the next business day following your procedure to check on you and address any questions or concerns that you may have regarding the information given to you following your procedure. If we do not reach you, we will leave a message.  However, if you are feeling well and you are not experiencing any problems, there is no need to return our call.  We will assume that you have returned to your regular daily activities without incident.  If any biopsies were taken you will be contacted by phone or by letter within the next 1-3 weeks.  Please call us at [phone] if you have not heard about the biopsies in 3 weeks.    SIGNATURES/CONFIDENTIALITY: You and/or your care partner have signed paperwork which will be entered into your electronic medical record.  These signatures attest to the fact that that the information above on your After Visit Summary has been reviewed and is understood.  Full responsibility of the confidentiality of this discharge information lies with you and/or your care-partner.   Resume medications. Information given on polyps. 

## 2016-02-24 ENCOUNTER — Telehealth: Payer: Self-pay

## 2016-02-24 NOTE — Telephone Encounter (Signed)
  Follow up Call-  Call back number 02/23/2016  Post procedure Call Back phone  # [phone]  Permission to leave phone message Yes  Some recent data might be hidden     Patient questions:  Do you have a fever, pain , or abdominal swelling? No. Pain Score  0 *  Have you tolerated food without any problems? Yes.    Have you been able to return to your normal activities? Yes.    Do you have any questions about your discharge instructions: Diet   No. Medications  No. Follow up visit  No.  Do you have questions or concerns about your Care? No.  Actions: * If pain score is 4 or above: No action needed, pain <4.

## 2016-03-02 ENCOUNTER — Encounter: Payer: Self-pay | Admitting: Gastroenterology

## 2016-04-10 ENCOUNTER — Encounter (HOSPITAL_BASED_OUTPATIENT_CLINIC_OR_DEPARTMENT_OTHER): Payer: Self-pay | Admitting: Emergency Medicine

## 2016-04-10 ENCOUNTER — Emergency Department (HOSPITAL_BASED_OUTPATIENT_CLINIC_OR_DEPARTMENT_OTHER): Payer: Managed Care, Other (non HMO)

## 2016-04-10 ENCOUNTER — Emergency Department (HOSPITAL_BASED_OUTPATIENT_CLINIC_OR_DEPARTMENT_OTHER)
Admission: EM | Admit: 2016-04-10 | Discharge: 2016-04-10 | Disposition: A | Discharge status: Home / Self Care | Payer: Managed Care, Other (non HMO) | Attending: Emergency Medicine | Admitting: Emergency Medicine

## 2016-04-10 DIAGNOSIS — I10 Essential (primary) hypertension: Secondary | ICD-10-CM | POA: Diagnosis not present

## 2016-04-10 DIAGNOSIS — S61211A Laceration without foreign body of left index finger without damage to nail, initial encounter: Secondary | ICD-10-CM | POA: Diagnosis present

## 2016-04-10 DIAGNOSIS — Y999 Unspecified external cause status: Secondary | ICD-10-CM | POA: Insufficient documentation

## 2016-04-10 DIAGNOSIS — W268XXA Contact with other sharp object(s), not elsewhere classified, initial encounter: Secondary | ICD-10-CM | POA: Insufficient documentation

## 2016-04-10 DIAGNOSIS — Z79899 Other long term (current) drug therapy: Secondary | ICD-10-CM | POA: Insufficient documentation

## 2016-04-10 DIAGNOSIS — Y9389 Activity, other specified: Secondary | ICD-10-CM | POA: Insufficient documentation

## 2016-04-10 DIAGNOSIS — Y929 Unspecified place or not applicable: Secondary | ICD-10-CM | POA: Insufficient documentation

## 2016-04-10 MED ORDER — LIDOCAINE-EPINEPHRINE 1 %-1:100000 IJ SOLN: 20.0000 mL | Freq: Once | INTRAMUSCULAR | Status: DC

## 2016-04-10 MED ORDER — LIDOCAINE-EPINEPHRINE (PF) 1 %-1:200000 IJ SOLN
INTRAMUSCULAR | Status: AC
  Filled 2016-04-10: qty 30

## 2016-04-10 MED ORDER — CEPHALEXIN 500 MG PO CAPS: 500.0000 mg | ORAL_CAPSULE | Freq: Four times a day (QID) | ORAL | 0 refills | Status: DC

## 2016-04-10 NOTE — ED Provider Notes (Signed)
[facility] Provider Note   CSN: [id number] Arrival date & time: 04/10/16  1750  By signing my name below, I, Reola Mosher, attest that this documentation has been prepared under the direction and in the presence of Deno Etienne, DO. Electronically Signed: Reola Mosher, ED Scribe. 04/10/16. 6:59 PM.  History   Chief Complaint Chief Complaint  Patient presents with  . Finger Injury   The history is provided by the patient. No language interpreter was used.  Laceration   The incident occurred less than 1 hour ago. The laceration is located on the left hand. The laceration mechanism was a a metal edge and a clean knife. The pain is moderate. The pain has been improving since onset. He reports no foreign bodies present. His tetanus status is UTD.   HPI Comments: Norman Dunn is a 55 y.o. male who presents to the Emergency Department complaining of laceration sustained to the distal portion of the left second digit onset just PTA. Bleeding is controlled. He notes associated gradually improving, throbbing pain to the area since the incident. Pt reports that he was sawing wood with a table saw, when his digit same under the saw, sustaining his laceration. Pt washed the wound and applied hydrogen peroxide to the wound prior to coming into the ED. He states that his pain to the area is moderately exacerbated with palpation to the area. He denies numbness, weakness, or any other associated symptoms. Tetanus is UTD.   Past Medical History:  Diagnosis Date  . GERD (gastroesophageal reflux disease)   . High cholesterol   . Hypertension   . OSA (obstructive sleep apnea)   . Sleep apnea    cpap occasionally   Patient Active Problem List   Diagnosis Date Noted  . GERD (gastroesophageal reflux disease) 07/12/2013  . OSA (obstructive sleep apnea) 09/07/2012   Past Surgical History:  Procedure Laterality Date  . COLONOSCOPY    . INGUINAL HERNIA REPAIR     right side    . POLYPECTOMY      Home Medications    Prior to Admission medications   Medication Sig Start Date End Date Taking? Authorizing Provider  omeprazole (PRILOSEC) 20 MG capsule Take 1 capsule (20 mg total) by mouth daily. 02/27/15  Yes Chesley Mires, MD  cephALEXin (KEFLEX) 500 MG capsule Take 1 capsule (500 mg total) by mouth 4 (four) times daily. 04/10/16   Deno Etienne, DO  valACYclovir (VALTREX) 500 MG tablet Take 500 mg by mouth as needed.  05/06/14   Historical Provider, MD   Family History Family History  Problem Relation Age of Onset  . Prostate cancer Father   . Colon cancer Neg Hx   . Colon polyps Neg Hx   . Rectal cancer Neg Hx   . Stomach cancer Neg Hx   . Esophageal cancer Neg Hx    Social History Social History  Substance Use Topics  . Smoking status: Never Smoker  . Smokeless tobacco: Never Used  . Alcohol use Yes     Comment: occasionally   Allergies   Review of patient's allergies indicates no known allergies.  Review of Systems Review of Systems  Constitutional: Negative for chills and fever.  HENT: Negative for congestion and facial swelling.   Eyes: Negative for discharge and visual disturbance.  Respiratory: Negative for shortness of breath.   Cardiovascular: Negative for chest pain and palpitations.  Gastrointestinal: Negative for abdominal pain, diarrhea and vomiting.  Musculoskeletal: Negative for arthralgias and  myalgias.  Skin: Positive for wound. Negative for color change and rash.  Neurological: Negative for tremors, syncope, weakness and headaches.  Psychiatric/Behavioral: Negative for confusion and dysphoric mood.  All other systems reviewed and are negative.  Physical Exam Updated Vital Signs BP 127/86 (BP Location: Right Arm)   Pulse 60   Temp 97.8 F (36.6 C) (Oral)   Resp 20   Ht 5\' 10"  (1.778 m)   Wt 200 lb (90.7 kg)   SpO2 96%   BMI 28.70 kg/m   Physical Exam  Constitutional: He is oriented to person, place, and time. He appears  well-developed and well-nourished.  HENT:  Head: Normocephalic and atraumatic.  Eyes: EOM are normal. Pupils are equal, round, and reactive to light.  Neck: Normal range of motion. Neck supple. No JVD present.  Cardiovascular: Normal rate and regular rhythm.  Exam reveals no gallop and no friction rub.   No murmur heard. Pulmonary/Chest: No respiratory distress. He has no wheezes.  Abdominal: He exhibits no distension. There is no rebound and no guarding.  Musculoskeletal: Normal range of motion.  Avulsion to the distal nail with a .1cm laceration to the tip of the finger. No exposed bone. Intact sensation to the area.   Neurological: He is alert and oriented to person, place, and time.  Skin: No rash noted. No pallor.  Psychiatric: He has a normal mood and affect. His behavior is normal.  Nursing note and vitals reviewed.  ED Treatments / Results  DIAGNOSTIC STUDIES: Oxygen Saturation is 98% on RA, normal by my interpretation.   COORDINATION OF CARE: 6:59 PM-Discussed next steps with pt. Pt verbalized understanding and is agreeable with the plan.   Labs (all labs ordered are listed, but only abnormal results are displayed) Labs Reviewed - No data to display  EKG  EKG Interpretation None      Radiology Dg Finger Index Left  Result Date: 04/10/2016 CLINICAL DATA:  Saw injury to left index finger.  Initial encounter. EXAM: LEFT INDEX FINGER 2+V COMPARISON:  None. FINDINGS: A tiny tuft fracture is identified. No subluxation, dislocation or radiopaque foreign body identified. No other abnormalities are present. IMPRESSION: Tiny tuft fracture. Electronically Signed   By: Margarette Canada M.D.   On: 04/10/2016 18:53    Procedures .Nerve Block Date/Time: 04/10/2016 7:38 PM Performed by: Tyrone Nine Decker Cogdell Authorized by: Deno Etienne   Consent:    Consent obtained:  Verbal   Consent given by:  Patient   Risks discussed:  Pain, unsuccessful block and bleeding   Alternatives discussed:  No  treatment and alternative treatment Universal protocol:    Procedure explained and questions answered to patient or proxy's satisfaction: yes     Relevant documents present and verified: yes     Test results available and properly labeled: yes     Imaging studies available: yes     Required blood products, implants, devices, and special equipment available: yes     Site marked: yes     Time out called: yes   Indications:    Indications:  Pain relief Location:    Body area:  Upper extremity   Upper extremity nerve:  Metacarpal   Laterality:  Left Pre-procedure details:    Skin preparation:  2% chlorhexidine   Preparation: Patient was prepped and draped in usual sterile fashion   Skin anesthesia (see MAR for exact dosages):    Skin anesthesia method:  Local infiltration   Local anesthetic:  Lidocaine 1% WITH epi Procedure details (see  MAR for exact dosages):    Block needle gauge:  25 G   Anesthetic injected:  Lidocaine 1% WITH epi   Steroid injected:  None   Additive injected:  None   Injection procedure:  Anatomic landmarks identified, anatomic landmarks palpated and negative aspiration for blood   Paresthesia:  None Post-procedure details:    Dressing:  Sterile dressing   Outcome:  Anesthesia achieved   Patient tolerance of procedure:  Tolerated well, no immediate complications     Medications Ordered in ED Medications  lidocaine-EPINEPHrine (XYLOCAINE W/EPI) 1 %-1:100000 (with pres) injection 20 mL (not administered)  lidocaine-EPINEPHrine (XYLOCAINE-EPINEPHrine) 1 %-1:200000 (PF) injection (not administered)   Initial Impression / Assessment and Plan / ED Course  I have reviewed the triage vital signs and the nursing notes.  Pertinent labs & imaging results that were available during my care of the patient were reviewed by me and considered in my medical decision making (see chart for details).  Clinical Course   55 yo M with hand saw injury to Second finger of the  left hand. This resulted in the very small tuft fracture on x-ray. Patient was nerve blocks and cleaned out thoroughly at bedside. Start on clindamycin. Family physician follow-up.  9:00 PM:  I have discussed the diagnosis/risks/treatment options with the patient and family and believe the pt to be eligible for discharge home to follow-up with PCP. We also discussed returning to the ED immediately if new or worsening sx occur. We discussed the sx which are most concerning (e.g., sudden worsening pain, fever, redness,drainage) that necessitate immediate return. Medications administered to the patient during their visit and any new prescriptions provided to the patient are listed below.  Medications given during this visit Medications  lidocaine-EPINEPHrine (XYLOCAINE W/EPI) 1 %-1:100000 (with pres) injection 20 mL (not administered)  lidocaine-EPINEPHrine (XYLOCAINE-EPINEPHrine) 1 %-1:200000 (PF) injection (not administered)     The patient appears reasonably screen and/or stabilized for discharge and I doubt any other medical condition or other [facility] requiring further screening, evaluation, or treatment in the ED at this time prior to discharge.    Final Clinical Impressions(s) / ED Diagnoses   Final diagnoses:  Laceration of left index finger, foreign body presence unspecified, nail damage status unspecified, initial encounter   New Prescriptions Discharge Medication List as of 04/10/2016  7:42 PM    START taking these medications   Details  cephALEXin (KEFLEX) 500 MG capsule Take 1 capsule (500 mg total) by mouth 4 (four) times daily., Starting Sat 04/10/2016, Print       I personally performed the services described in this documentation, which was scribed in my presence. The recorded information has been reviewed and is accurate.     Deno Etienne, DO 04/10/16 2100

## 2016-04-10 NOTE — ED Triage Notes (Signed)
L index finger injury with a table saw 2 hours ago.

## 2016-04-10 NOTE — ED Notes (Signed)
MD at bedside. 

## 2016-04-10 NOTE — ED Notes (Signed)
Pt given d/c instructions as per chart. Verbalizes understanding. No questions. Rx x 1 

## 2016-04-20 ENCOUNTER — Ambulatory Visit (INDEPENDENT_AMBULATORY_CARE_PROVIDER_SITE_OTHER): Payer: Managed Care, Other (non HMO) | Admitting: Pulmonary Disease

## 2016-04-20 ENCOUNTER — Encounter: Payer: Self-pay | Admitting: Pulmonary Disease

## 2016-04-20 VITALS — BP 124/72 | HR 69 | Ht 70.0 in | Wt 202.4 lb

## 2016-04-20 DIAGNOSIS — G4733 Obstructive sleep apnea (adult) (pediatric): Secondary | ICD-10-CM | POA: Diagnosis not present

## 2016-04-20 DIAGNOSIS — Z9989 Dependence on other enabling machines and devices: Secondary | ICD-10-CM | POA: Diagnosis not present

## 2016-04-20 NOTE — Progress Notes (Signed)
Current Outpatient Prescriptions on File Prior to Visit  Medication Sig  . cephALEXin (KEFLEX) 500 MG capsule Take 1 capsule (500 mg total) by mouth 4 (four) times daily.  Marland Kitchen omeprazole (PRILOSEC) 20 MG capsule Take 1 capsule (20 mg total) by mouth daily.  . valACYclovir (VALTREX) 500 MG tablet Take 500 mg by mouth as needed.    Current Facility-Administered Medications on File Prior to Visit  Medication  . 0.9 %  sodium chloride infusion    Chief Complaint  Patient presents with  . Follow-up    Pt states that he does not wear CPAP very often. Pt states that he has tried several masks and he has not been able to tolerate any of them. DME: [employer]    Sleep tests PSG 03/02/07 >> AHI 38.6, SpO2 low 82%; CPAP 14 cm H2O >> AHI 0 Auto CPAP 01/01/16 to 01/30/16 >> used on 5 of 25 nights with average 4 hrs 4 min.  Average AHI 10.2 with mean CPAP 13 and 90 th percentile CPAP 15 cm H2O  Past medical history GERD, HLD, HTN  Past surgical history, Family history, Social history, Allergies reviewed  Vital signs BP 124/72 (BP Location: Left Arm, Cuff Size: Normal)   Pulse 69   Ht 5\' 10"  (1.778 m)   Wt 202 lb 6.4 oz (91.8 kg)   SpO2 97%   BMI 29.04 kg/m    History of Present Illness: Norman Dunn is a 55 y.o. male with severe OSA.  He was laid off from his job that he had been working at for the past 31 years - this happened yesterday!  He continues to have trouble using CPAP.  He feels the pressure from his machine isn't right and it blows too hard most of the time.  His machine is at least 54 years old.  He has a new mask which fits better.  He feels like CPAP helps when he can use it for the whole night.  Physical Exam:  General - No distress ENT - No sinus tenderness, MP 3, no oral exudate, no LAN Cardiac - s1s2 regular, no murmur Chest - No wheeze/rales/dullness Back - No focal tenderness Abd - Soft, non-tender Ext - No edema Neuro - Normal strength Skin - No rashes Psych -  normal mood, and behavior   Assessment/Plan:  Obstructive sleep apnea. - will arrange for new CPAP machine since his current machine is more than 54 years old - explained he might need repeat sleep study for insurance coverage of new machine - will have his machine set at 8 cm H2O, review download, and then determine if he needs adjustments to his pressure setting  - if he is not able to adjust to CPAP, then will need to arrange for dental assessment for oral appliance    Patient Instructions  Will arrange for new CPAP machine  Follow up in 4 months    Chesley Mires, MD [facility] Pager:  [phone] 04/20/2016, 11:38 AM

## 2016-04-20 NOTE — Patient Instructions (Signed)
Will arrange for new CPAP machine  Follow up in 4 months 

## 2016-06-26 ENCOUNTER — Encounter (HOSPITAL_COMMUNITY)
Admission: EM | Disposition: A | Discharge status: Home / Self Care | Payer: Self-pay | Source: Home / Self Care | Attending: Emergency Medicine

## 2016-06-26 ENCOUNTER — Emergency Department (HOSPITAL_BASED_OUTPATIENT_CLINIC_OR_DEPARTMENT_OTHER): Payer: Managed Care, Other (non HMO)

## 2016-06-26 ENCOUNTER — Emergency Department (HOSPITAL_COMMUNITY): Payer: Managed Care, Other (non HMO) | Admitting: Anesthesiology

## 2016-06-26 ENCOUNTER — Encounter (HOSPITAL_BASED_OUTPATIENT_CLINIC_OR_DEPARTMENT_OTHER): Payer: Self-pay | Admitting: Emergency Medicine

## 2016-06-26 ENCOUNTER — Ambulatory Visit (HOSPITAL_BASED_OUTPATIENT_CLINIC_OR_DEPARTMENT_OTHER)
Admission: EM | Admit: 2016-06-26 | Discharge: 2016-06-27 | Disposition: A | Discharge status: Home / Self Care | Payer: Managed Care, Other (non HMO) | Attending: Emergency Medicine | Admitting: Emergency Medicine

## 2016-06-26 DIAGNOSIS — S62616A Displaced fracture of proximal phalanx of right little finger, initial encounter for closed fracture: Secondary | ICD-10-CM | POA: Diagnosis not present

## 2016-06-26 DIAGNOSIS — Y9389 Activity, other specified: Secondary | ICD-10-CM | POA: Insufficient documentation

## 2016-06-26 DIAGNOSIS — I10 Essential (primary) hypertension: Secondary | ICD-10-CM | POA: Insufficient documentation

## 2016-06-26 DIAGNOSIS — G4733 Obstructive sleep apnea (adult) (pediatric): Secondary | ICD-10-CM | POA: Insufficient documentation

## 2016-06-26 DIAGNOSIS — K219 Gastro-esophageal reflux disease without esophagitis: Secondary | ICD-10-CM | POA: Diagnosis not present

## 2016-06-26 DIAGNOSIS — Y998 Other external cause status: Secondary | ICD-10-CM | POA: Diagnosis not present

## 2016-06-26 DIAGNOSIS — S61216A Laceration without foreign body of right little finger without damage to nail, initial encounter: Secondary | ICD-10-CM | POA: Insufficient documentation

## 2016-06-26 DIAGNOSIS — W312XXA Contact with powered woodworking and forming machines, initial encounter: Secondary | ICD-10-CM | POA: Insufficient documentation

## 2016-06-26 DIAGNOSIS — Y9289 Other specified places as the place of occurrence of the external cause: Secondary | ICD-10-CM | POA: Diagnosis not present

## 2016-06-26 DIAGNOSIS — S67196A Crushing injury of right little finger, initial encounter: Secondary | ICD-10-CM | POA: Insufficient documentation

## 2016-06-26 DIAGNOSIS — E7800 Pure hypercholesterolemia, unspecified: Secondary | ICD-10-CM | POA: Insufficient documentation

## 2016-06-26 DIAGNOSIS — S62619B Displaced fracture of proximal phalanx of unspecified finger, initial encounter for open fracture: Secondary | ICD-10-CM | POA: Diagnosis present

## 2016-06-26 HISTORY — PX: PERCUTANEOUS PINNING: SHX2209

## 2016-06-26 SURGERY — PINNING, EXTREMITY, PERCUTANEOUS
Anesthesia: General | Site: Hand | Laterality: Right

## 2016-06-26 MED ORDER — SUFENTANIL CITRATE 50 MCG/ML IV SOLN
INTRAVENOUS | Status: AC
  Filled 2016-06-26: qty 1

## 2016-06-26 MED ORDER — OXYCODONE HCL 5 MG/5ML PO SOLN: 5.0000 mg | Freq: Once | ORAL | Status: DC | PRN

## 2016-06-26 MED ORDER — CEFAZOLIN SODIUM 1 G IJ SOLR
INTRAMUSCULAR | Status: AC
  Filled 2016-06-26: qty 10

## 2016-06-26 MED ORDER — SODIUM CHLORIDE 0.9 % IR SOLN
Status: DC | PRN
  Administered 2016-06-26: 3000 mL

## 2016-06-26 MED ORDER — LIDOCAINE HCL (PF) 1 % IJ SOLN
5.0000 mL | Freq: Once | INTRAMUSCULAR | Status: DC
  Filled 2016-06-26: qty 5

## 2016-06-26 MED ORDER — MIDAZOLAM HCL 2 MG/2ML IJ SOLN
INTRAMUSCULAR | Status: AC
  Filled 2016-06-26: qty 2

## 2016-06-26 MED ORDER — SUFENTANIL CITRATE 50 MCG/ML IV SOLN
INTRAVENOUS | Status: DC | PRN
  Administered 2016-06-26 (×2): 10 ug via INTRAVENOUS

## 2016-06-26 MED ORDER — CEFAZOLIN IN D5W 1 GM/50ML IV SOLN
INTRAVENOUS | Status: AC
  Filled 2016-06-26: qty 50

## 2016-06-26 MED ORDER — HYDROCODONE-ACETAMINOPHEN 5-325 MG PO TABS: ORAL_TABLET | ORAL | 0 refills | Status: DC

## 2016-06-26 MED ORDER — CEFAZOLIN SODIUM-DEXTROSE 2-3 GM-% IV SOLR
INTRAVENOUS | Status: DC | PRN
Start: 2016-06-26 — End: 2016-06-26
  Administered 2016-06-26: 2 g via INTRAVENOUS

## 2016-06-26 MED ORDER — LACTATED RINGERS IV SOLN
INTRAVENOUS | Status: DC | PRN
  Administered 2016-06-26: 22:00:00 via INTRAVENOUS

## 2016-06-26 MED ORDER — SODIUM CHLORIDE 0.9 % IJ SOLN
INTRAMUSCULAR | Status: AC
  Filled 2016-06-26: qty 10

## 2016-06-26 MED ORDER — OXYCODONE HCL 5 MG PO TABS: 5.0000 mg | ORAL_TABLET | Freq: Once | ORAL | Status: DC | PRN

## 2016-06-26 MED ORDER — PROPOFOL 10 MG/ML IV BOLUS
INTRAVENOUS | Status: DC | PRN
  Administered 2016-06-26: 200 mg via INTRAVENOUS

## 2016-06-26 MED ORDER — SUCCINYLCHOLINE CHLORIDE 200 MG/10ML IV SOSY
PREFILLED_SYRINGE | INTRAVENOUS | Status: AC
  Filled 2016-06-26: qty 10

## 2016-06-26 MED ORDER — PROPOFOL 10 MG/ML IV BOLUS
INTRAVENOUS | Status: AC
  Filled 2016-06-26: qty 40

## 2016-06-26 MED ORDER — LIDOCAINE 2% (20 MG/ML) 5 ML SYRINGE
INTRAMUSCULAR | Status: AC
  Filled 2016-06-26: qty 5

## 2016-06-26 MED ORDER — FENTANYL CITRATE (PF) 100 MCG/2ML IJ SOLN: 25.0000 ug | INTRAMUSCULAR | Status: DC | PRN

## 2016-06-26 MED ORDER — SULFAMETHOXAZOLE-TRIMETHOPRIM 800-160 MG PO TABS: 1.0000 | ORAL_TABLET | Freq: Two times a day (BID) | ORAL | 0 refills | Status: DC

## 2016-06-26 MED ORDER — ONDANSETRON HCL 4 MG/2ML IJ SOLN
INTRAMUSCULAR | Status: AC
  Filled 2016-06-26: qty 2

## 2016-06-26 MED ORDER — ONDANSETRON HCL 4 MG/2ML IJ SOLN
INTRAMUSCULAR | Status: DC | PRN
  Administered 2016-06-26: 4 mg via INTRAVENOUS

## 2016-06-26 MED ORDER — BUPIVACAINE HCL (PF) 0.25 % IJ SOLN
INTRAMUSCULAR | Status: AC
  Filled 2016-06-26: qty 30

## 2016-06-26 MED ORDER — MIDAZOLAM HCL 2 MG/2ML IJ SOLN
INTRAMUSCULAR | Status: DC | PRN
  Administered 2016-06-26: 2 mg via INTRAVENOUS

## 2016-06-26 MED ORDER — PREDNISONE 20 MG PO TABS
ORAL_TABLET | ORAL | Status: AC
  Filled 2016-06-26: qty 1

## 2016-06-26 MED ORDER — CEFAZOLIN IN D5W 1 GM/50ML IV SOLN
1.0000 g | Freq: Once | INTRAVENOUS | Status: AC
  Administered 2016-06-26: 1 g via INTRAVENOUS
  Filled 2016-06-26: qty 50

## 2016-06-26 MED ORDER — OXYCODONE-ACETAMINOPHEN 5-325 MG PO TABS
1.0000 | ORAL_TABLET | Freq: Once | ORAL | Status: DC
  Filled 2016-06-26: qty 1

## 2016-06-26 MED ORDER — LIDOCAINE HCL (CARDIAC) 20 MG/ML IV SOLN
INTRAVENOUS | Status: DC | PRN
  Administered 2016-06-26: 60 mg via INTRATRACHEAL

## 2016-06-26 MED ORDER — OXYCODONE-ACETAMINOPHEN 5-325 MG PO TABS: 1.0000 | ORAL_TABLET | Freq: Once | ORAL | Status: DC

## 2016-06-26 MED ORDER — BUPIVACAINE HCL (PF) 0.25 % IJ SOLN
INTRAMUSCULAR | Status: DC | PRN
  Administered 2016-06-26: 10 mL

## 2016-06-26 MED ORDER — MORPHINE SULFATE (PF) 4 MG/ML IV SOLN
4.0000 mg | Freq: Once | INTRAVENOUS | Status: AC
  Administered 2016-06-26: 4 mg via INTRAVENOUS
  Filled 2016-06-26: qty 1

## 2016-06-26 SURGICAL SUPPLY — 52 items
BANDAGE ACE 3X5.8 VEL STRL LF (GAUZE/BANDAGES/DRESSINGS) ×3 IMPLANT
BANDAGE ACE 4X5 VEL STRL LF (GAUZE/BANDAGES/DRESSINGS) ×3 IMPLANT
BANDAGE COBAN STERILE 2 (GAUZE/BANDAGES/DRESSINGS) IMPLANT
BANDAGE ELASTIC 3 VELCRO ST LF (GAUZE/BANDAGES/DRESSINGS) ×3 IMPLANT
BENZOIN TINCTURE PRP APPL 2/3 (GAUZE/BANDAGES/DRESSINGS) ×3 IMPLANT
BLADE SURG ROTATE 9660 (MISCELLANEOUS) ×3 IMPLANT
BNDG ESMARK 4X9 LF (GAUZE/BANDAGES/DRESSINGS) ×3 IMPLANT
BNDG GAUZE ELAST 4 BULKY (GAUZE/BANDAGES/DRESSINGS) ×3 IMPLANT
CHLORAPREP W/TINT 26ML (MISCELLANEOUS) ×3 IMPLANT
CLOSURE WOUND 1/2 X4 (GAUZE/BANDAGES/DRESSINGS) ×1
CORDS BIPOLAR (ELECTRODE) ×3 IMPLANT
COVER SURGICAL LIGHT HANDLE (MISCELLANEOUS) ×3 IMPLANT
CUFF TOURNIQUET SINGLE 18IN (TOURNIQUET CUFF) IMPLANT
CUFF TOURNIQUET SINGLE 24IN (TOURNIQUET CUFF) IMPLANT
DRAPE C-ARM MINI 42X72 WSTRAPS (DRAPES) ×3 IMPLANT
DRAPE OEC MINIVIEW 54X84 (DRAPES) ×3 IMPLANT
DRAPE SURG 17X23 STRL (DRAPES) ×3 IMPLANT
DRSG EMULSION OIL 3X3 NADH (GAUZE/BANDAGES/DRESSINGS) ×3 IMPLANT
GAUZE SPONGE 4X4 12PLY STRL (GAUZE/BANDAGES/DRESSINGS) ×3 IMPLANT
GAUZE XEROFORM 1X8 LF (GAUZE/BANDAGES/DRESSINGS) ×3 IMPLANT
GLOVE BIO SURGEON STRL SZ7.5 (GLOVE) ×3 IMPLANT
GLOVE BIOGEL PI IND STRL 8 (GLOVE) ×1 IMPLANT
GLOVE BIOGEL PI INDICATOR 8 (GLOVE) ×2
GOWN STRL REUS W/ TWL LRG LVL3 (GOWN DISPOSABLE) ×3 IMPLANT
GOWN STRL REUS W/TWL LRG LVL3 (GOWN DISPOSABLE) ×6
KIT BASIN OR (CUSTOM PROCEDURE TRAY) ×3 IMPLANT
KIT ROOM TURNOVER OR (KITS) ×3 IMPLANT
MANIFOLD NEPTUNE II (INSTRUMENTS) ×3 IMPLANT
NEEDLE HYPO 25GX1X1/2 BEV (NEEDLE) ×3 IMPLANT
NS IRRIG 1000ML POUR BTL (IV SOLUTION) ×3 IMPLANT
PACK ORTHO EXTREMITY (CUSTOM PROCEDURE TRAY) ×3 IMPLANT
PAD ARMBOARD 7.5X6 YLW CONV (MISCELLANEOUS) ×6 IMPLANT
PAD CAST 3X4 CTTN HI CHSV (CAST SUPPLIES) ×1 IMPLANT
PAD CAST 4YDX4 CTTN HI CHSV (CAST SUPPLIES) ×1 IMPLANT
PADDING CAST COTTON 3X4 STRL (CAST SUPPLIES) ×2
PADDING CAST COTTON 4X4 STRL (CAST SUPPLIES) ×2
SPLINT PLASTER EXTRA FAST 3X15 (CAST SUPPLIES) ×4
SPLINT PLASTER GYPS XFAST 3X15 (CAST SUPPLIES) ×2 IMPLANT
SPONGE GAUZE 4X4 12PLY STER LF (GAUZE/BANDAGES/DRESSINGS) ×3 IMPLANT
STRIP CLOSURE SKIN 1/2X4 (GAUZE/BANDAGES/DRESSINGS) ×2 IMPLANT
SUCTION FRAZIER HANDLE 10FR (MISCELLANEOUS) ×2
SUCTION TUBE FRAZIER 10FR DISP (MISCELLANEOUS) ×1 IMPLANT
SUT ETHILON 4 0 P 3 18 (SUTURE) IMPLANT
SUT ETHILON 4 0 PS 2 18 (SUTURE) ×3 IMPLANT
SUT PROLENE 4 0 P 3 18 (SUTURE) IMPLANT
SYR CONTROL 10ML LL (SYRINGE) ×3 IMPLANT
TOWEL OR 17X24 6PK STRL BLUE (TOWEL DISPOSABLE) ×3 IMPLANT
TOWEL OR 17X26 10 PK STRL BLUE (TOWEL DISPOSABLE) ×3 IMPLANT
TUBE CONNECTING 12'X1/4 (SUCTIONS) ×1
TUBE CONNECTING 12X1/4 (SUCTIONS) ×2 IMPLANT
TUBE FEEDING 5FR 15 INCH (TUBING) IMPLANT
WATER STERILE IRR 1000ML POUR (IV SOLUTION) ×3 IMPLANT

## 2016-06-26 NOTE — Anesthesia Preprocedure Evaluation (Addendum)
Anesthesia Evaluation  Patient identified by MRN, date of birth, ID band Patient awake    Reviewed: Allergy & Precautions, NPO status , Patient's Chart, lab work & pertinent test results  History of Anesthesia Complications Negative for: history of anesthetic complications  Airway Mallampati: II  TM Distance: >3 FB Neck ROM: Full    Dental  (+) Teeth Intact, Dental Advisory Given   Pulmonary sleep apnea and Continuous Positive Airway Pressure Ventilation ,    breath sounds clear to auscultation       Cardiovascular hypertension,  Rhythm:Regular     Neuro/Psych negative neurological ROS  negative psych ROS   GI/Hepatic Neg liver ROS, GERD  Controlled and Medicated,  Endo/Other  negative endocrine ROS  Renal/GU negative Renal ROS     Musculoskeletal   Abdominal   Peds  Hematology negative hematology ROS (+)   Anesthesia Other Findings   Reproductive/Obstetrics                           Anesthesia Physical Anesthesia Plan  ASA: II  Anesthesia Plan: General   Post-op Pain Management:    Induction: Intravenous  Airway Management Planned: LMA and Oral ETT  Additional Equipment: None  Intra-op Plan:   Post-operative Plan: Extubation in OR  Informed Consent: I have reviewed the patients History and Physical, chart, labs and discussed the procedure including the risks, benefits and alternatives for the proposed anesthesia with the patient or authorized representative who has indicated his/her understanding and acceptance.   Dental advisory given  Plan Discussed with: CRNA and Surgeon  Anesthesia Plan Comments:         Anesthesia Quick Evaluation

## 2016-06-26 NOTE — Transfer of Care (Signed)
Immediate Anesthesia Transfer of Care Note  Patient: Norman Dunn  Procedure(s) Performed: Procedure(s): RIGHT SMALL FINGER IRRIGATION AND DEBRIDEMENT, POSSIBLE PINNING OF RIGHT SMALL FINGER FRACTURE (Right)  Patient Location: PACU  Anesthesia Type:General  Level of Consciousness: awake  Airway & Oxygen Therapy: Patient Spontanous Breathing and Patient connected to nasal cannula oxygen  Post-op Assessment: Report given to RN and Post -op Vital signs reviewed and stable  Post vital signs: Reviewed and stable  Last Vitals:  Vitals:   06/26/16 1845 06/26/16 2000  BP: 139/89 142/92  Pulse:  (!) 56  Resp:    Temp:      Last Pain:  Vitals:   06/26/16 2138  TempSrc:   PainSc: 4          Complications: No apparent anesthesia complications

## 2016-06-26 NOTE — ED Provider Notes (Signed)
Patient seen at [facility] today. Determined to open fracture of fifth finger right hand as result of injury by Wood splinter approximately 1 PM today. No other injury. Patient is current on tetanus immunization. On exam he is alert and in no distress. Right upper extremity there is a laceration the length of the proximal phalanx of the fifth finger. Fifth finger has good capillary refill. Sensation is intact to light touch. Dr. Fredna Dow has arranged to meet patient here. X-ray viewed by me   Orlie Dakin, MD 06/27/16 [id number]

## 2016-06-26 NOTE — Op Note (Signed)
[id number] 

## 2016-06-26 NOTE — Brief Op Note (Signed)
06/26/2016  11:06 PM  PATIENT:  Otto Herb  55 y.o. male  PRE-OPERATIVE DIAGNOSIS:  RIGHT SMALL FINGER PROXIMAL PHALANX FRACTURE  POST-OPERATIVE DIAGNOSIS:  RIGHT SMALL FINGER PROXIMAL PHALANX FRACTURE  PROCEDURE:  Procedure(s): RIGHT SMALL FINGER IRRIGATION AND DEBRIDEMENT, POSSIBLE PINNING OF RIGHT SMALL FINGER FRACTURE (Right)  SURGEON:  Surgeon(s) and Role:    * Leanora Cover, MD - Primary  PHYSICIAN ASSISTANT:   ASSISTANTS: none   ANESTHESIA:   general  EBL:  Total I/O In: 500 [I.V.:500] Out: 10 [Blood:10]  BLOOD ADMINISTERED:none  DRAINS: none   LOCAL MEDICATIONS USED:  MARCAINE     SPECIMEN:  No Specimen  DISPOSITION OF SPECIMEN:  N/A  COUNTS:  YES  TOURNIQUET:   Total Tourniquet Time Documented: Upper Arm (Right) - 21 minutes Total: Upper Arm (Right) - 21 minutes   DICTATION: .Other Dictation: Dictation Number [id number]  PLAN OF CARE: Discharge to home after PACU  PATIENT DISPOSITION:  PACU - hemodynamically stable.

## 2016-06-26 NOTE — H&P (Signed)
Norman Dunn is an 55 y.o. male.   Chief Complaint: right small finger fracture HPI: 55 yo rhd male present with wife states he injured right small finger in a log splitter this afternoon.  Describes a throbbing pain of 6/10 severity.  Worsened with motion and alleviated with immobilization.  He reports no previous injury to the hand and no other injury at this time.   Case discussed with Julianne Rice, MD and his note from 06/26/2016 reviewed. Xrays viewed and interpreted by me: ap, lateral, oblique views of right small finger show comminuted intraarticular fracture of proximal phalanx with minimal displacement. Labs reviewed: none  Allergies: No Known Allergies  Past Medical History:  Diagnosis Date  . GERD (gastroesophageal reflux disease)   . High cholesterol   . Hypertension   . OSA (obstructive sleep apnea)   . Sleep apnea    cpap occasionally    Past Surgical History:  Procedure Laterality Date  . COLONOSCOPY    . INGUINAL HERNIA REPAIR     right side  . POLYPECTOMY      Family History: Family History  Problem Relation Age of Onset  . Prostate cancer Father   . Colon cancer Neg Hx   . Colon polyps Neg Hx   . Rectal cancer Neg Hx   . Stomach cancer Neg Hx   . Esophageal cancer Neg Hx     Social History:   reports that he has never smoked. He has never used smokeless tobacco. He reports that he drinks alcohol. He reports that he does not use drugs.  Medications:  (Not in a hospital admission)  No results found for this or any previous visit (from the past 48 hour(s)).  Dg Finger Little Right  Result Date: 06/26/2016 CLINICAL DATA:  Smashed little finger on right hand in a wood splitter today. EXAM: RIGHT LITTLE FINGER 2+V COMPARISON:  None. FINDINGS: There is soft tissue swelling and laceration of the right fifth digit. Comminuted fracture of the proximal phalanx extends to the distal articular surface. The middle and distal phalanges appear intact.  IMPRESSION: 1. Soft tissue swelling and laceration. 2. Comminuted fracture of the proximal phalanx of the fifth digit. Electronically Signed   By: Nolon Nations M.D.   On: 06/26/2016 14:46     A comprehensive review of systems was negative. Review of Systems: No fevers, chills, night sweats, chest pain, shortness of breath, nausea, vomiting, diarrhea, constipation, easy bleeding or bruising, headaches, dizziness, vision changes, fainting.   Blood pressure 139/89, pulse (!) 58, temperature 98.4 F (36.9 C), temperature source Oral, resp. rate 20, height 5\' 10"  (1.778 m), weight 90.3 kg (199 lb), SpO2 98 %.  General appearance: alert, cooperative and appears stated age Head: Normocephalic, without obvious abnormality, atraumatic Neck: supple, symmetrical, trachea midline Resp: clear to auscultation bilaterally Cardio: regular rate and rhythm GI: non-tender Extremities: Intact sensation and capillary refill all digits.  +epl/fpl/io.  Wound on radial side of right small finger proximal phalanx.  Intact sensation distally both radially and ulnarly, though  He states the radial side feels a little different.  Intact fdp to testing. Pulses: 2+ and symmetric Skin: Skin color, texture, turgor normal. No rashes or lesions Neurologic: Grossly normal Incision/Wound: As above  Assessment/Plan Right small finger crush injury with proximal phalanx fracture and wound to proximal phalanx.  Recommend irrigation and debridement and exploration of wound in OR with repair tendon/artery/nerve as necessary and possible fixation of the fracture if necessary.  Risks, benefits, and  alternatives of surgery were discussed and the patient agrees with the plan of care.   Carlito Bogert R 06/26/2016, 7:50 PM

## 2016-06-26 NOTE — Discharge Instructions (Signed)

## 2016-06-26 NOTE — ED Triage Notes (Signed)
States got 5th finger right hand caught in a wood splitter, about 30 ago, bleeding controlled, has dressing intact, CMS intact to right wrist. Lac noted to 5th finger, jagged about 4-5 cm

## 2016-06-26 NOTE — ED Notes (Signed)
Hand tray at bedside.

## 2016-06-26 NOTE — ED Notes (Signed)
Notified Network engineer to please page hand surgeon upon pt's arrival to room

## 2016-06-26 NOTE — Anesthesia Procedure Notes (Signed)
Procedure Name: LMA Insertion Date/Time: 06/26/2016 10:32 PM Performed by: Samarah Hogle S Pre-anesthesia Checklist: Patient identified, Emergency Drugs available, Suction available, Patient being monitored and Timeout performed Patient Re-evaluated:Patient Re-evaluated prior to inductionOxygen Delivery Method: Circle system utilized Preoxygenation: Pre-oxygenation with 100% oxygen Intubation Type: IV induction Ventilation: Mask ventilation without difficulty LMA: LMA inserted LMA Size: 4.0 Number of attempts: 1 Placement Confirmation: positive ETCO2 and breath sounds checked- equal and bilateral Tube secured with: Tape Dental Injury: Teeth and Oropharynx as per pre-operative assessment

## 2016-06-26 NOTE — ED Provider Notes (Signed)
[place] [facility]PT [facility] Provider Note   CSN: [id number] Arrival date & time: 06/26/16  1409  By signing my name below, I, Ephriam Jenkins, attest that this documentation has been prepared under the direction and in the presence of Julianne Rice, MD. Electronically signed, Ephriam Jenkins, ED Scribe. 06/26/16. 3:57 PM.  Electronically Signed: Ephriam Jenkins, ED Scribe. 06/26/16. 3:58 PM.  History   Chief Complaint Chief Complaint  Patient presents with  . Extremity Laceration   HPI HPI Comments: Norman Dunn is a 55 y.o. male right-hand dominant who presents to the Emergency Department s/p an injury to his right hand that occurred at approximately 1300 today. Pt states that he caught his 5th right finger in a wood splitter.  Pt states that he may have heard a "pop" when the incident occurred. Bleeding is currently controlled by a bandage. No obvious deformity. Pt states that his Tetanus is UTD from a previous visit here in October of this year. No numbness but states has decreased range of motion due to pain. No other injuries  The history is provided by the patient. No language interpreter was used.   Past Medical History:  Diagnosis Date  . GERD (gastroesophageal reflux disease)   . High cholesterol   . Hypertension   . OSA (obstructive sleep apnea)   . Sleep apnea    cpap occasionally    Patient Active Problem List   Diagnosis Date Noted  . GERD (gastroesophageal reflux disease) 07/12/2013  . OSA (obstructive sleep apnea) 09/07/2012    Past Surgical History:  Procedure Laterality Date  . COLONOSCOPY    . INGUINAL HERNIA REPAIR     right side  . POLYPECTOMY       Home Medications    Prior to Admission medications   Medication Sig Start Date End Date Taking? Authorizing Provider  omeprazole (PRILOSEC) 20 MG capsule Take 1 capsule (20 mg total) by mouth daily. 02/27/15  Yes Chesley Mires, MD  cephALEXin (KEFLEX) 500 MG capsule Take 1 capsule (500 mg total) by mouth 4 (four)  times daily. 04/10/16   Deno Etienne, DO  valACYclovir (VALTREX) 500 MG tablet Take 500 mg by mouth as needed.  05/06/14   Historical Provider, MD    Family History Family History  Problem Relation Age of Onset  . Prostate cancer Father   . Colon cancer Neg Hx   . Colon polyps Neg Hx   . Rectal cancer Neg Hx   . Stomach cancer Neg Hx   . Esophageal cancer Neg Hx     Social History Social History  Substance Use Topics  . Smoking status: Never Smoker  . Smokeless tobacco: Never Used  . Alcohol use Yes     Comment: occasionally   Allergies   Patient has no known allergies.   Review of Systems Review of Systems  Skin: Positive for wound.  Neurological: Negative for numbness.  All other systems reviewed and are negative.    Physical Exam Updated Vital Signs BP 128/88 (BP Location: Right Arm)   Pulse 70   Temp 97.9 F (36.6 C) (Oral)   Resp 18   Ht 5\' 10"  (1.778 m)   Wt 199 lb (90.3 kg)   SpO2 97%   BMI 28.55 kg/m   Physical Exam  Constitutional: He is oriented to person, place, and time. He appears well-developed and well-nourished.  HENT:  Head: Normocephalic and atraumatic.  Eyes: EOM are normal. Pupils are equal, round, and reactive to light.  Neck: Normal  range of motion. Neck supple.  Cardiovascular: Normal rate and regular rhythm.   Pulmonary/Chest: Effort normal.  Abdominal: Soft.  Musculoskeletal: He exhibits tenderness. He exhibits no edema or deformity.  Patient has diffuse tenderness to the proximal phalanx of the fifth digit of the right hand. There is an irregular laceration roughly 4-5 cm in length running up the medial surface of the proximal phalanx. There is exposed underlying subcutaneous fat. Minimal bleeding.  Neurological: He is alert and oriented to person, place, and time.  Sensation to the distal tip of the fifth digit of the right hand is fully intact. Patient has decreased range of motion of the fifth digit due to pain making motor testing  difficult.  Skin: Skin is warm and dry. Capillary refill takes less than 2 seconds. No rash noted. No erythema.  Psychiatric: He has a normal mood and affect. His behavior is normal.  Nursing note and vitals reviewed.    ED Treatments / Results  DIAGNOSTIC STUDIES: Oxygen Saturation is 98% on RA, normal by my interpretation.  COORDINATION OF CARE: 3:58 PM-Discussed treatment plan with pt at bedside and pt agreed to plan.   Labs (all labs ordered are listed, but only abnormal results are displayed) Labs Reviewed - No data to display  EKG  EKG Interpretation None       Radiology Dg Finger Little Right  Result Date: 06/26/2016 CLINICAL DATA:  Smashed little finger on right hand in a wood splitter today. EXAM: RIGHT LITTLE FINGER 2+V COMPARISON:  None. FINDINGS: There is soft tissue swelling and laceration of the right fifth digit. Comminuted fracture of the proximal phalanx extends to the distal articular surface. The middle and distal phalanges appear intact. IMPRESSION: 1. Soft tissue swelling and laceration. 2. Comminuted fracture of the proximal phalanx of the fifth digit. Electronically Signed   By: Nolon Nations M.D.   On: 06/26/2016 14:46    Procedures Procedures (including critical care time)  Medications Ordered in ED Medications  ceFAZolin (ANCEF) IVPB 1 g/50 mL premix (0 g Intravenous Stopped 06/26/16 1723)  lidocaine (PF) (XYLOCAINE) 1 % injection 5 mL (5 mLs Intradermal Given by Other 06/26/16 1629)     Initial Impression / Assessment and Plan / ED Course  I have reviewed the triage vital signs and the nursing notes.  Pertinent labs & imaging results that were available during my care of the patient were reviewed by me and considered in my medical decision making (see chart for details).  Clinical Course    Will start on Ancef. Discussed with Dr.Kuzma. Recommend transfer to [facility] emergency department and he will evaluate patient. Dr Theodosia Blender will  accept patient in transfer. Patient is okay to transfer by private vehicle with IV in place.  Final Clinical Impressions(s) / ED Diagnoses   Final diagnoses:  Open fracture of proximal phalanx of digit of right hand   New Prescriptions New Prescriptions   No medications on file  I personally performed the services described in this documentation, which was scribed in my presence. The recorded information has been reviewed and is accurate.       Julianne Rice, MD 06/26/16 1725

## 2016-06-27 ENCOUNTER — Encounter (HOSPITAL_COMMUNITY): Payer: Self-pay | Admitting: Orthopedic Surgery

## 2016-06-27 NOTE — Op Note (Signed)
NAME:  Norman Dunn, Norman Dunn NO.:  [id number]  MEDICAL RECORD NO.:  [id number]  LOCATION:  [facility]                         FACILITY:  [facility]  PHYSICIAN:  Leanora Cover, MD        DATE OF BIRTH:  1960-10-13  DATE OF PROCEDURE:  06/26/2016 DATE OF DISCHARGE:                              OPERATIVE REPORT   PREOPERATIVE DIAGNOSES:  Right small finger possible open proximal phalanx fracture, possible tendon, artery, and nerve injury.  POSTOPERATIVE DIAGNOSES:  Right small finger proximal phalanx fracture and small finger wound.  PROCEDURE:   1. Right small finger exploration of traumatic wound 2. Right small finger closed treatment of comminuted proximal phalanx fracture.  SURGEON:  Leanora Cover, MD.  ASSISTANT:  None.  ANESTHESIA:  General.  IV FLUIDS:  Per Anesthesia flow sheet.  ESTIMATED BLOOD LOSS:  Minimal.  COMPLICATIONS:  None.  SPECIMENS:  None.  TOURNIQUET TIME:  21 minutes.  DISPOSITION:  Stable to PACU.  INDICATIONS:  Norman Dunn is a 55 year old male who was using a wood splitter earlier today when his right small finger was caught in a machine.  He was seen [employer].  Radiographs were taken revealing a small finger proximal phalanx fracture.  He was noted to have a laceration of the proximal phalanx.  He was transferred to [facility] for further care.  I recommended operative exploration with possible pinning of fracture and repair of tendon, artery, and nerve as necessary.  Risks, benefits, and alternatives of surgery were discussed including risk of blood loss; infection; damage to nerves, vessels, tendons, ligaments, bone; failure of surgery, need for additional surgery; complications with wound healing; continued pain; nonunion; malunion; and stiffness.  He voiced understanding of these risks and elected to proceed.  OPERATIVE COURSE:  After being identified preoperatively by myself, the patient and I agreed upon procedure and site of  procedure.  Surgical site was marked.  The risks, benefits, and alternatives of surgery were reviewed and he wished to proceed.  Surgical consent had been signed. He was given IV Ancef as preoperative antibiotic prophylaxis.  He was transferred to operating room and placed on the operating room table in supine position with the right upper extremity on arm board.  General anesthesia was induced by anesthesiologist.  Right upper extremity was prepped and draped in normal sterile orthopedic fashion.  Surgical pause was performed between surgeons, anesthesia and operating staff, and all were in agreement as to the patient, procedure, and site of procedure. Tourniquet at the proximal aspect of the extremity was inflated to 250 mmHg after exsanguination of the limb with an Esmarch bandage.  The wound was explored.  The tendons and sheath were intact.  The ulnar digital nerve and artery were outside the zone of injury.  The radial digital nerve and artery were identified and were intact.  There was no gross contamination.  The wound was copiously irrigated with sterile saline.  It was then closed with 4-0 nylon in a horizontal mattress fashion.  Given the nondisplaced nature of the fracture, it was felt that leaving this without fixation at this time was appropriate with a good splint.  We will  follow up with radiographs and if there is any displacement, we will proceed with operative fixation.  The wound was dressed with sterile Xeroform, 4x4s, and wrapped with a Kerlix bandage. A volar and dorsal slab splint was placed and wrapped with Kerlix and Ace bandage.  This included the long ring and small fingers with the MPs flexed and the IPs extended.  A digital block had been performed with 10 mL of 0.25% plain Marcaine to aid in postoperative analgesia.  The tourniquet was deflated at 21 minutes.  Fingertips were pink with brisk capillary refill after deflation of tourniquet.  The operative  drapes were broken down, the patient was awoken from anesthesia safely.  He was transferred back to stretcher and taken to PACU in stable condition.  I will see him back in the office 1 week for postoperative followup.  I will give him Norco 5/325 one to two p.o. q.6 hours p.r.n. pain, dispensed #30, and Bactrim DS 1 p.o. b.i.d. x7 days.     Leanora Cover, MD     KK/MEDQ  D:  06/26/2016  T:  06/27/2016  Job:  [id number]

## 2016-06-28 NOTE — Anesthesia Postprocedure Evaluation (Signed)
Anesthesia Post Note  Patient: Norman Dunn  Procedure(s) Performed: Procedure(s) (LRB): RIGHT SMALL FINGER IRRIGATION AND DEBRIDEMENT, POSSIBLE PINNING OF RIGHT SMALL FINGER FRACTURE (Right)  Patient location during evaluation: PACU Anesthesia Type: General Level of consciousness: awake and alert Pain management: pain level controlled Vital Signs Assessment: post-procedure vital signs reviewed and stable Respiratory status: spontaneous breathing, nonlabored ventilation, respiratory function stable and patient connected to nasal cannula oxygen Cardiovascular status: blood pressure returned to baseline and stable Postop Assessment: no signs of nausea or vomiting Anesthetic complications: no       Last Vitals:  Vitals:   06/26/16 2343 06/26/16 2345  BP: (!) 130/91   Pulse: (!) 57   Resp: 12   Temp:  36.4 C    Last Pain:  Vitals:   06/26/16 2345  TempSrc:   PainSc: 0-No pain                 Dustin Bumbaugh

## 2016-07-05 ENCOUNTER — Encounter (HOSPITAL_COMMUNITY): Payer: Self-pay | Admitting: Orthopedic Surgery

## 2016-09-16 ENCOUNTER — Ambulatory Visit: Payer: Managed Care, Other (non HMO) | Admitting: Pulmonary Disease

## 2016-10-06 ENCOUNTER — Other Ambulatory Visit: Payer: Self-pay | Admitting: Orthopedic Surgery

## 2016-10-14 ENCOUNTER — Encounter (HOSPITAL_BASED_OUTPATIENT_CLINIC_OR_DEPARTMENT_OTHER): Payer: Self-pay | Admitting: *Deleted

## 2016-10-19 ENCOUNTER — Ambulatory Visit (HOSPITAL_BASED_OUTPATIENT_CLINIC_OR_DEPARTMENT_OTHER): Payer: 59 | Admitting: Anesthesiology

## 2016-10-19 ENCOUNTER — Encounter (HOSPITAL_BASED_OUTPATIENT_CLINIC_OR_DEPARTMENT_OTHER): Payer: Self-pay | Admitting: Anesthesiology

## 2016-10-19 ENCOUNTER — Ambulatory Visit (HOSPITAL_BASED_OUTPATIENT_CLINIC_OR_DEPARTMENT_OTHER)
Admission: RE | Admit: 2016-10-19 | Discharge: 2016-10-19 | Disposition: A | Discharge status: Home / Self Care | Payer: 59 | Source: Ambulatory Visit | Attending: Orthopedic Surgery | Admitting: Orthopedic Surgery

## 2016-10-19 ENCOUNTER — Encounter (HOSPITAL_BASED_OUTPATIENT_CLINIC_OR_DEPARTMENT_OTHER)
Admission: RE | Disposition: A | Discharge status: Home / Self Care | Payer: Self-pay | Source: Ambulatory Visit | Attending: Orthopedic Surgery

## 2016-10-19 DIAGNOSIS — Z800 Family history of malignant neoplasm of digestive organs: Secondary | ICD-10-CM | POA: Diagnosis not present

## 2016-10-19 DIAGNOSIS — Z8042 Family history of malignant neoplasm of prostate: Secondary | ICD-10-CM | POA: Insufficient documentation

## 2016-10-19 DIAGNOSIS — S6721XD Crushing injury of right hand, subsequent encounter: Secondary | ICD-10-CM | POA: Insufficient documentation

## 2016-10-19 DIAGNOSIS — G4733 Obstructive sleep apnea (adult) (pediatric): Secondary | ICD-10-CM | POA: Insufficient documentation

## 2016-10-19 DIAGNOSIS — E7800 Pure hypercholesterolemia, unspecified: Secondary | ICD-10-CM | POA: Insufficient documentation

## 2016-10-19 DIAGNOSIS — Z79899 Other long term (current) drug therapy: Secondary | ICD-10-CM | POA: Diagnosis not present

## 2016-10-19 DIAGNOSIS — M24541 Contracture, right hand: Secondary | ICD-10-CM | POA: Diagnosis not present

## 2016-10-19 DIAGNOSIS — X58XXXD Exposure to other specified factors, subsequent encounter: Secondary | ICD-10-CM | POA: Insufficient documentation

## 2016-10-19 DIAGNOSIS — K219 Gastro-esophageal reflux disease without esophagitis: Secondary | ICD-10-CM | POA: Insufficient documentation

## 2016-10-19 DIAGNOSIS — L905 Scar conditions and fibrosis of skin: Secondary | ICD-10-CM | POA: Diagnosis not present

## 2016-10-19 HISTORY — PX: DUPUYTREN CONTRACTURE RELEASE: SHX1478

## 2016-10-19 SURGERY — RELEASE, DUPUYTREN CONTRACTURE
Anesthesia: General | Site: Finger | Laterality: Right

## 2016-10-19 MED ORDER — LIDOCAINE HCL (CARDIAC) 20 MG/ML IV SOLN
INTRAVENOUS | Status: DC | PRN
  Administered 2016-10-19: 30 mg via INTRAVENOUS

## 2016-10-19 MED ORDER — ONDANSETRON HCL 4 MG/2ML IJ SOLN
INTRAMUSCULAR | Status: DC | PRN
  Administered 2016-10-19: 4 mg via INTRAVENOUS

## 2016-10-19 MED ORDER — CHLORHEXIDINE GLUCONATE 4 % EX LIQD: 60.0000 mL | Freq: Once | CUTANEOUS | Status: DC

## 2016-10-19 MED ORDER — FENTANYL CITRATE (PF) 100 MCG/2ML IJ SOLN
50.0000 ug | INTRAMUSCULAR | Status: DC | PRN
  Administered 2016-10-19: 100 ug via INTRAVENOUS

## 2016-10-19 MED ORDER — MIDAZOLAM HCL 2 MG/2ML IJ SOLN
1.0000 mg | INTRAMUSCULAR | Status: DC | PRN
  Administered 2016-10-19: 2 mg via INTRAVENOUS

## 2016-10-19 MED ORDER — MIDAZOLAM HCL 2 MG/2ML IJ SOLN
INTRAMUSCULAR | Status: AC
  Filled 2016-10-19: qty 2

## 2016-10-19 MED ORDER — SCOPOLAMINE 1 MG/3DAYS TD PT72: 1.0000 | MEDICATED_PATCH | Freq: Once | TRANSDERMAL | Status: DC | PRN

## 2016-10-19 MED ORDER — PROPOFOL 10 MG/ML IV BOLUS
INTRAVENOUS | Status: AC
Start: 2016-10-19 — End: 2016-10-19
  Filled 2016-10-19: qty 20

## 2016-10-19 MED ORDER — CEFAZOLIN SODIUM-DEXTROSE 2-4 GM/100ML-% IV SOLN
2.0000 g | INTRAVENOUS | Status: AC
  Administered 2016-10-19: 2 g via INTRAVENOUS

## 2016-10-19 MED ORDER — PROPOFOL 500 MG/50ML IV EMUL
INTRAVENOUS | Status: DC | PRN
  Administered 2016-10-19: 75 ug/kg/min via INTRAVENOUS

## 2016-10-19 MED ORDER — ROPIVACAINE HCL 5 MG/ML IJ SOLN
INTRAMUSCULAR | Status: DC | PRN
  Administered 2016-10-19: 30 mL via PERINEURAL

## 2016-10-19 MED ORDER — FENTANYL CITRATE (PF) 100 MCG/2ML IJ SOLN
INTRAMUSCULAR | Status: AC
  Filled 2016-10-19: qty 2

## 2016-10-19 MED ORDER — CEFAZOLIN SODIUM-DEXTROSE 2-4 GM/100ML-% IV SOLN
INTRAVENOUS | Status: AC
  Filled 2016-10-19: qty 100

## 2016-10-19 MED ORDER — LACTATED RINGERS IV SOLN
INTRAVENOUS | Status: DC
  Administered 2016-10-19: 13:00:00 via INTRAVENOUS

## 2016-10-19 MED ORDER — HYDROCODONE-ACETAMINOPHEN 5-325 MG PO TABS: ORAL_TABLET | ORAL | 0 refills | Status: DC

## 2016-10-19 SURGICAL SUPPLY — 40 items
BANDAGE ACE 3X5.8 VEL STRL LF (GAUZE/BANDAGES/DRESSINGS) ×3 IMPLANT
BLADE MINI RND TIP GREEN BEAV (BLADE) IMPLANT
BLADE SURG 15 STRL LF DISP TIS (BLADE) ×2 IMPLANT
BLADE SURG 15 STRL SS (BLADE) ×4
BNDG ELASTIC 2X5.8 VLCR STR LF (GAUZE/BANDAGES/DRESSINGS) IMPLANT
BNDG ESMARK 4X9 LF (GAUZE/BANDAGES/DRESSINGS) IMPLANT
BNDG GAUZE ELAST 4 BULKY (GAUZE/BANDAGES/DRESSINGS) ×3 IMPLANT
CHLORAPREP W/TINT 26ML (MISCELLANEOUS) ×3 IMPLANT
CORDS BIPOLAR (ELECTRODE) ×3 IMPLANT
COVER BACK TABLE 60X90IN (DRAPES) ×3 IMPLANT
COVER MAYO STAND STRL (DRAPES) ×3 IMPLANT
CUFF TOURNIQUET SINGLE 18IN (TOURNIQUET CUFF) ×3 IMPLANT
DRAPE EXTREMITY T 121X128X90 (DRAPE) ×3 IMPLANT
DRAPE SURG 17X23 STRL (DRAPES) ×3 IMPLANT
GAUZE SPONGE 4X4 12PLY STRL (GAUZE/BANDAGES/DRESSINGS) ×3 IMPLANT
GAUZE XEROFORM 1X8 LF (GAUZE/BANDAGES/DRESSINGS) ×3 IMPLANT
GLOVE BIO SURGEON STRL SZ 6.5 (GLOVE) ×2 IMPLANT
GLOVE BIO SURGEON STRL SZ7.5 (GLOVE) ×3 IMPLANT
GLOVE BIO SURGEONS STRL SZ 6.5 (GLOVE) ×1
GLOVE BIOGEL PI IND STRL 7.0 (GLOVE) ×2 IMPLANT
GLOVE BIOGEL PI IND STRL 8.5 (GLOVE) ×1 IMPLANT
GLOVE BIOGEL PI INDICATOR 7.0 (GLOVE) ×4
GLOVE BIOGEL PI INDICATOR 8.5 (GLOVE) ×2
GOWN STRL REUS W/ TWL LRG LVL3 (GOWN DISPOSABLE) ×1 IMPLANT
GOWN STRL REUS W/TWL LRG LVL3 (GOWN DISPOSABLE) ×2
GOWN STRL REUS W/TWL XL LVL3 (GOWN DISPOSABLE) ×3 IMPLANT
NEEDLE HYPO 25X1 1.5 SAFETY (NEEDLE) ×3 IMPLANT
NS IRRIG 1000ML POUR BTL (IV SOLUTION) ×3 IMPLANT
PACK BASIN DAY SURGERY FS (CUSTOM PROCEDURE TRAY) ×3 IMPLANT
PAD CAST 3X4 CTTN HI CHSV (CAST SUPPLIES) ×1 IMPLANT
PADDING CAST ABS 4INX4YD NS (CAST SUPPLIES) ×2
PADDING CAST ABS COTTON 4X4 ST (CAST SUPPLIES) ×1 IMPLANT
PADDING CAST COTTON 3X4 STRL (CAST SUPPLIES) ×2
STOCKINETTE 4X48 STRL (DRAPES) ×3 IMPLANT
SUT ETHILON 4 0 PS 2 18 (SUTURE) ×3 IMPLANT
SUT SILK 4 0 PS 2 (SUTURE) IMPLANT
SYR BULB 3OZ (MISCELLANEOUS) ×3 IMPLANT
SYR CONTROL 10ML LL (SYRINGE) ×3 IMPLANT
TOWEL OR 17X24 6PK STRL BLUE (TOWEL DISPOSABLE) ×6 IMPLANT
UNDERPAD 30X30 (UNDERPADS AND DIAPERS) ×3 IMPLANT

## 2016-10-19 NOTE — H&P (Signed)
  Norman Dunn is an 56 y.o. male.   Chief Complaint: right small finger contracture HPI: 56 yo male with contracture/stiffness right small finger after crush injury in wood splitter.  Has not improved with therapy.  He wishes to undergo z-plasty of his scar and pip joint release to try to improve digital motion.  Allergies: No Known Allergies  Past Medical History:  Diagnosis Date  . GERD (gastroesophageal reflux disease)   . High cholesterol   . OSA (obstructive sleep apnea)   . Sleep apnea    cpap every night    Past Surgical History:  Procedure Laterality Date  . COLONOSCOPY    . ESOPHAGEAL DILATION    . INGUINAL HERNIA REPAIR     right side  . PERCUTANEOUS PINNING Right 06/26/2016   Procedure: RIGHT SMALL FINGER IRRIGATION AND DEBRIDEMENT, POSSIBLE PINNING OF RIGHT SMALL FINGER FRACTURE;  Surgeon: Leanora Cover, MD;  Location: [place];  Service: Orthopedics;  Laterality: Right;  . POLYPECTOMY      Family History: Family History  Problem Relation Age of Onset  . Prostate cancer Father   . Colon cancer Neg Hx   . Colon polyps Neg Hx   . Rectal cancer Neg Hx   . Stomach cancer Neg Hx   . Esophageal cancer Neg Hx     Social History:   reports that he has never smoked. He has never used smokeless tobacco. He reports that he drinks alcohol. He reports that he does not use drugs.  Medications: Medications Prior to Admission  Medication Sig Dispense Refill  . Multiple Vitamin (MULTIVITAMIN WITH MINERALS) TABS tablet Take 1 tablet by mouth daily as needed (OCCASIONAL).    Marland Kitchen omeprazole (PRILOSEC) 20 MG capsule Take 1 capsule (20 mg total) by mouth daily. (Patient taking differently: Take 20 mg by mouth as needed (HEARTBURN). ) 30 capsule 5  . HYDROcodone-acetaminophen (NORCO) 5-325 MG tablet 1-2 tabs po q6 hours prn pain 30 tablet 0    No results found for this or any previous visit (from the past 48 hour(s)).  No results found.   A comprehensive review of systems was  negative.  Blood pressure 122/84, pulse (!) 57, temperature 98.7 F (37.1 C), temperature source Oral, resp. rate 16, height 5\' 10"  (1.778 m), weight 90.3 kg (199 lb), SpO2 97 %.  General appearance: alert, cooperative and appears stated age Head: Normocephalic, without obvious abnormality, atraumatic Neck: supple, symmetrical, trachea midline Resp: clear to auscultation bilaterally Cardio: regular rate and rhythm GI: non-tender Extremities: Intact sensation and capillary refill all digits.  +epl/fpl/io.  No wounds. Stiffness of right small finger Pulses: 2+ and symmetric Skin: Skin color, texture, turgor normal. No rashes or lesions Neurologic: Grossly normal Incision/Wound:none  Assessment/Plan Right small finger stiffness/contracture after crush injury.  Non operative and operative treatment options were discussed with the patient and patient wishes to proceed with operative treatment. Risks, benefits, and alternatives of surgery were discussed and the patient agrees with the plan of care.   Akili Cuda R 10/19/2016, 12:42 PM

## 2016-10-19 NOTE — Op Note (Signed)
I assisted Surgeon(s) and Role:    * Leanora Cover, MD - Primary    * Daryll Brod, MD - Assisting on the Procedure(s): RIGHT SMALL FINGER Z-PLASTY, Blanket on 10/19/2016.  I provided assistance on this case as follows: approach, release of the joint, tenolysis, z-plasty and closure. I was present for the entire case.  Electronically signed by: Wynonia Sours, MD Date: 10/19/2016 Time: 3:38 PM

## 2016-10-19 NOTE — Brief Op Note (Signed)
10/19/2016  3:39 PM  PATIENT:  Norman Dunn  56 y.o. male  PRE-OPERATIVE DIAGNOSIS:  RIGHT SMALL PROXIMAL INTERPHALANGEAL CONTRACTURE AND SCAR CONTRACTURE  POST-OPERATIVE DIAGNOSIS:  RIGHT SMALL PROXIMAL INTERPHALANGEAL CONTRACTURE AND SCAR   PROCEDURE:  Procedure(s) with comments: RIGHT SMALL FINGER Z-PLASTY, PROXIMAL INTERPHALANGEAL JOINT RELEASE (Right) - axillary block in preop  SURGEON:  Surgeon(s) and Role:    * Leanora Cover, MD - Primary    * Daryll Brod, MD - Assisting  PHYSICIAN ASSISTANT:   ASSISTANTS: Daryll Brod, MD   ANESTHESIA:   regional and IV sedation  EBL:  Total I/O In: 800 [I.V.:800] Out: -   BLOOD ADMINISTERED:none  DRAINS: none   LOCAL MEDICATIONS USED:  NONE  SPECIMEN:  No Specimen  DISPOSITION OF SPECIMEN:  N/A  COUNTS:  YES  TOURNIQUET:   Total Tourniquet Time Documented: Upper Arm (Right) - 42 minutes Total: Upper Arm (Right) - 42 minutes   DICTATION: .Other Dictation: Dictation Number [id number]  PLAN OF CARE: Discharge to home after PACU  PATIENT DISPOSITION:  PACU - hemodynamically stable.

## 2016-10-19 NOTE — Anesthesia Postprocedure Evaluation (Signed)
Anesthesia Post Note  Patient: Norman Dunn  Procedure(s) Performed: Procedure(s) (LRB): RIGHT SMALL FINGER Z-PLASTY, PROXIMAL INTERPHALANGEAL JOINT RELEASE (Right)  Patient location during evaluation: PACU Anesthesia Type: MAC Level of consciousness: awake and alert Pain management: pain level controlled Vital Signs Assessment: post-procedure vital signs reviewed and stable Respiratory status: spontaneous breathing, nonlabored ventilation, respiratory function stable and patient connected to nasal cannula oxygen Cardiovascular status: stable and blood pressure returned to baseline Anesthetic complications: no       Last Vitals:  Vitals:   10/19/16 1540 10/19/16 1608  BP:  113/73  Pulse: (!) 58 (!) 57  Resp: 18 20  Temp: 36.6 C 36.5 C    Last Pain:  Vitals:   10/19/16 1608  TempSrc:   PainSc: 0-No pain                 Charli Liberatore EDWARD

## 2016-10-19 NOTE — Anesthesia Procedure Notes (Signed)
Procedure Name: MAC Date/Time: 10/19/2016 2:51 PM Performed by: Trisha Ken D Pre-anesthesia Checklist: Patient identified, Emergency Drugs available, Suction available, Patient being monitored and Timeout performed Patient Re-evaluated:Patient Re-evaluated prior to inductionOxygen Delivery Method: Simple face mask

## 2016-10-19 NOTE — Anesthesia Procedure Notes (Signed)
Anesthesia Regional Block: Axillary brachial plexus block   Pre-Anesthetic Checklist: ,, timeout performed, Correct Patient, Correct Site, Correct Laterality, Correct Procedure, Correct Position, site marked, Risks and benefits discussed, pre-op evaluation,  At surgeon's request and post-op pain management  Laterality: Right  Prep: chloraprep       Needles:   Needle Type: Echogenic Needle     Needle Length: 9cm  Needle Gauge: 21     Additional Needles:   Procedures: ultrasound guided,,,,,,,,  Narrative:  Start time: 10/19/2016 12:54 PM End time: 10/19/2016 1:02 PM Injection made incrementally with aspirations every 5 mL. Anesthesiologist: Lyndle Herrlich

## 2016-10-19 NOTE — Op Note (Signed)
[id number] 

## 2016-10-19 NOTE — Progress Notes (Signed)
Assisted Dr. Lyndle Herrlich with right, ultrasound guided, axillary block. Side rails up, monitors on throughout procedure. See vital signs in flow sheet. Tolerated Procedure well.

## 2016-10-19 NOTE — Anesthesia Preprocedure Evaluation (Addendum)
Anesthesia Evaluation  Patient identified by MRN, date of birth, ID band Patient awake    Reviewed: Allergy & Precautions, H&P , Patient's Chart, lab work & pertinent test results, reviewed documented beta blocker date and time   Airway Mallampati: II  TM Distance: >3 FB Neck ROM: full    Dental no notable dental hx.    Pulmonary sleep apnea ,    Pulmonary exam normal breath sounds clear to auscultation       Cardiovascular  Rhythm:regular Rate:Normal     Neuro/Psych    GI/Hepatic   Endo/Other    Renal/GU      Musculoskeletal   Abdominal   Peds  Hematology   Anesthesia Other Findings   Reproductive/Obstetrics                             Anesthesia Physical Anesthesia Plan  ASA: III  Anesthesia Plan: MAC   Post-op Pain Management:  Regional for Post-op pain   Induction: Intravenous  Airway Management Planned: Simple Face Mask and Natural Airway  Additional Equipment:   Intra-op Plan:   Post-operative Plan:   Informed Consent: I have reviewed the patients History and Physical, chart, labs and discussed the procedure including the risks, benefits and alternatives for the proposed anesthesia with the patient or authorized representative who has indicated his/her understanding and acceptance.   Dental Advisory Given  Plan Discussed with: CRNA and Surgeon  Anesthesia Plan Comments: ( )       Anesthesia Quick Evaluation

## 2016-10-19 NOTE — Discharge Instructions (Addendum)

## 2016-10-19 NOTE — Transfer of Care (Signed)
Immediate Anesthesia Transfer of Care Note  Patient: PESACH FRISCH  Procedure(s) Performed: Procedure(s) with comments: RIGHT SMALL FINGER Z-PLASTY, PROXIMAL INTERPHALANGEAL JOINT RELEASE (Right) - axillary block in preop  Patient Location: PACU  Anesthesia Type:MAC combined with regional for post-op pain  Level of Consciousness: awake, alert , oriented and patient cooperative  Airway & Oxygen Therapy: Patient Spontanous Breathing and Patient connected to face mask oxygen  Post-op Assessment: Report given to RN and Post -op Vital signs reviewed and stable  Post vital signs: Reviewed and stable  Last Vitals:  Vitals:   10/19/16 1400 10/19/16 1415  BP: 105/65 101/66  Pulse: (!) 57 (!) 56  Resp: 15 13  Temp:      Last Pain:  Vitals:   10/19/16 1217  TempSrc: Oral         Complications: No apparent anesthesia complications

## 2016-10-20 ENCOUNTER — Encounter (HOSPITAL_BASED_OUTPATIENT_CLINIC_OR_DEPARTMENT_OTHER): Payer: Self-pay | Admitting: Orthopedic Surgery

## 2016-10-20 NOTE — Op Note (Signed)
NAME:  Norman Dunn, Norman Dunn NO.:  [id number]  MEDICAL RECORD NO.:  [id number]  LOCATION:                                 FACILITY:  PHYSICIAN:  Norman Cover, MD        DATE OF BIRTH:  March 22, 1961  DATE OF PROCEDURE:  10/19/2016 DATE OF DISCHARGE:                              OPERATIVE REPORT   PREOPERATIVE DIAGNOSIS:  Right small finger scar contracture and proximal interphalangeal joint contracture with tendon adhesions.  POSTOPERATIVE DIAGNOSIS:  Right small finger scar contracture and proximal interphalangeal joint contracture with tendon adhesions.  PROCEDURE:   1. Right small finger Z-plasty rearrangement of volar soft tissues approximately 2 cm 2. Right small finger flexor tenolysis 3. Right small PIP contracture release.  SURGEON:  Norman Cover, MD.  ASSISTANT:  Norman Brod, MD.  ANESTHESIA:  Regional with sedation.  INTRAVENOUS FLUIDS:  Per anesthesia flow sheet.  ESTIMATED BLOOD LOSS:  Minimal.  COMPLICATIONS:  None.  SPECIMENS:  None.  TOURNIQUET TIME:  42 minutes.  DISPOSITION:  Stable to PACU.  INDICATIONS:  Norman Dunn is a 56 year old male, who sustained a crush injury to his right small finger with a log splitter.  This was managed nonoperatively.  He underwent therapy for motion.  He has had continued stiffness in the finger.  He wishes to undergo Z-plasty and PIP joint release with possible tenolysis as necessary.  Risks, benefits, and alternatives of surgery were discussed including risk of blood loss; infection; damage to nerves, vessels, tendons, ligaments, bone; failure of surgery; need for additional surgery; complications with wound healing; continued pain; and recurrence of stiffness.  He voiced understanding of these risks and elected to proceed.  OPERATIVE COURSE:  After being identified preoperatively by myself, the patient and I agreed upon procedure and site of procedure.  Surgical site was marked.  The risks, benefits, and  alternatives of surgery were reviewed and he wished to proceed.  Surgical consent had been signed. He was given IV Ancef as preoperative antibiotic prophylaxis.  He was transferred to the operating room and placed on the operating room table in supine position with the right upper extremity on arm board. Regional block had been performed by Anesthesia in preoperative holding. Right upper extremity was prepped and draped in normal sterile orthopedic fashion.  Surgical pause was performed between surgeons, anesthesia, and operating staff; and all were in agreement as to the patient, procedure, and site of procedure.  Tourniquet at the proximal aspect of the extremities inflated to 250 mmHg after exsanguination of the limb with an Esmarch bandage.  Incision was made over the previous scar.  This was carried into subcutaneous tissues by spreading technique.  There was significant scarring in the subcutaneous tissues. The radial and ulnar digital nerve and artery were identified and protected throughout the case.  The scar was carefully removed.  There was scarring of the flexor sheath.  This was opened and the scar released both radially and ulnarly.  The tendons were able to be mobilized within the sheath, releasing any adhesions.  The volar plate was then released sharply with the scissors.  This provided improved flexion of the finger.  The fingertip was able to be brought into the palm, but not in distal palmar crease.  There was some improvement in extension, though not fully.  The wound was copiously irrigated with sterile saline.  A Z-plasty of the wound was performed in 2 locations to relax the scar and break it up across the joint.  A 4-0 nylon suture was used in a horizontal mattress fashion to reapproximate skin edges.  The wound was dressed with sterile Xeroform, 4x4s, and wrapped with a Coban dressing lightly.  Tourniquet was deflated at 42 minutes.  Fingertips were pink with  brisk capillary refill after deflation of tourniquet. Operative drapes were broken down.  The patient was awoken from sedation safely.  He was transferred back to the stretcher and taken to the PACU in stable condition.  I will see him back in the office in 1 week for postoperative followup.  He is going to start therapy tomorrow for motion.  I will give him a prescription for Norco 5/325 one to two p.o. q.6 hours p.r.n. pain, dispensed #20.     Norman Cover, MD     KK/MEDQ  D:  10/19/2016  T:  10/20/2016  Job:  [id number]

## 2016-10-29 ENCOUNTER — Emergency Department (HOSPITAL_COMMUNITY): Payer: 59

## 2016-10-29 ENCOUNTER — Encounter (HOSPITAL_COMMUNITY): Payer: Self-pay | Admitting: *Deleted

## 2016-10-29 ENCOUNTER — Emergency Department (HOSPITAL_COMMUNITY)
Admission: EM | Admit: 2016-10-29 | Discharge: 2016-10-29 | Disposition: A | Discharge status: Home / Self Care | Payer: 59 | Attending: Emergency Medicine | Admitting: Emergency Medicine

## 2016-10-29 ENCOUNTER — Other Ambulatory Visit: Payer: Self-pay

## 2016-10-29 DIAGNOSIS — H538 Other visual disturbances: Secondary | ICD-10-CM

## 2016-10-29 DIAGNOSIS — H5330 Unspecified disorder of binocular vision: Secondary | ICD-10-CM | POA: Insufficient documentation

## 2016-10-29 DIAGNOSIS — Z79899 Other long term (current) drug therapy: Secondary | ICD-10-CM | POA: Diagnosis not present

## 2016-10-29 DIAGNOSIS — H532 Diplopia: Secondary | ICD-10-CM | POA: Diagnosis not present

## 2016-10-29 DIAGNOSIS — Z5181 Encounter for therapeutic drug level monitoring: Secondary | ICD-10-CM | POA: Insufficient documentation

## 2016-10-29 LAB — COMPREHENSIVE METABOLIC PANEL
ALK PHOS: 76 U/L (ref 38–126)
ALT: 26 U/L (ref 17–63)
AST: 22 U/L (ref 15–41)
Albumin: 4 g/dL (ref 3.5–5.0)
Anion gap: 8 (ref 5–15)
BILIRUBIN TOTAL: 0.6 mg/dL (ref 0.3–1.2)
BUN: 12 mg/dL (ref 6–20)
CHLORIDE: 104 mmol/L (ref 101–111)
CO2: 26 mmol/L (ref 22–32)
CREATININE: 0.75 mg/dL (ref 0.61–1.24)
Calcium: 9.1 mg/dL (ref 8.9–10.3)
Glucose, Bld: 98 mg/dL (ref 65–99)
Potassium: 3.9 mmol/L (ref 3.5–5.1)
Sodium: 138 mmol/L (ref 135–145)
TOTAL PROTEIN: 7.2 g/dL (ref 6.5–8.1)

## 2016-10-29 LAB — CBC
HEMATOCRIT: 41 % (ref 39.0–52.0)
Hemoglobin: 14.2 g/dL (ref 13.0–17.0)
MCH: 31.3 pg (ref 26.0–34.0)
MCHC: 34.6 g/dL (ref 30.0–36.0)
MCV: 90.3 fL (ref 78.0–100.0)
Platelets: 259 10*3/uL (ref 150–400)
RBC: 4.54 MIL/uL (ref 4.22–5.81)
RDW: 11.8 % (ref 11.5–15.5)
WBC: 7 10*3/uL (ref 4.0–10.5)

## 2016-10-29 LAB — DIFFERENTIAL
BASOS PCT: 1 %
Basophils Absolute: 0 10*3/uL (ref 0.0–0.1)
Eosinophils Absolute: 0.2 10*3/uL (ref 0.0–0.7)
Eosinophils Relative: 3 %
LYMPHS ABS: 2.1 10*3/uL (ref 0.7–4.0)
Lymphocytes Relative: 29 %
MONO ABS: 0.8 10*3/uL (ref 0.1–1.0)
MONOS PCT: 11 %
NEUTROS ABS: 4 10*3/uL (ref 1.7–7.7)
Neutrophils Relative %: 56 %

## 2016-10-29 LAB — I-STAT TROPONIN, ED: Troponin i, poc: 0 ng/mL (ref 0.00–0.08)

## 2016-10-29 LAB — PROTIME-INR
INR: 0.99
Prothrombin Time: 13.1 seconds (ref 11.4–15.2)

## 2016-10-29 LAB — APTT: aPTT: 30 seconds (ref 24–36)

## 2016-10-29 MED ORDER — GADOBENATE DIMEGLUMINE 529 MG/ML IV SOLN
20.0000 mL | Freq: Once | INTRAVENOUS | Status: AC
  Administered 2016-10-29: 19 mL via INTRAVENOUS

## 2016-10-29 NOTE — ED Notes (Addendum)
Patient called in waiting room and states started having another episode of blurry vision when patient was in wheel chair states vision is mildly blurry with both eyes open but it clears with one eye shut. Episode dissipated when he arrived to room. MD made aware.

## 2016-10-29 NOTE — ED Provider Notes (Signed)
[facility] DEPT Provider Note   CSN: [id number] Arrival date & time: 10/29/16  1252     History   Chief Complaint Chief Complaint  Patient presents with  . Blurred Vision    HPI Norman Dunn is a 56 y.o. male.  The history is provided by the patient.  Eye Problem   This is a recurrent problem. The current episode started 6 to 12 hours ago. Episode frequency: intermittent. The problem has been resolved. There is a problem in both eyes. There was no injury mechanism. The pain is mild. Associated symptoms include blurred vision and double vision. He has tried nothing for the symptoms.    Past Medical History:  Diagnosis Date  . GERD (gastroesophageal reflux disease)   . High cholesterol   . OSA (obstructive sleep apnea)   . Sleep apnea    cpap every night    Patient Active Problem List   Diagnosis Date Noted  . GERD (gastroesophageal reflux disease) 07/12/2013  . OSA (obstructive sleep apnea) 09/07/2012    Past Surgical History:  Procedure Laterality Date  . COLONOSCOPY    . DUPUYTREN CONTRACTURE RELEASE Right 10/19/2016   Procedure: RIGHT SMALL FINGER Z-PLASTY, PROXIMAL INTERPHALANGEAL JOINT RELEASE;  Surgeon: Leanora Cover, MD;  Location: [place];  Service: Orthopedics;  Laterality: Right;  axillary block in preop  . ESOPHAGEAL DILATION    . INGUINAL HERNIA REPAIR     right side  . PERCUTANEOUS PINNING Right 06/26/2016   Procedure: RIGHT SMALL FINGER IRRIGATION AND DEBRIDEMENT, POSSIBLE PINNING OF RIGHT SMALL FINGER FRACTURE;  Surgeon: Leanora Cover, MD;  Location: [facility];  Service: Orthopedics;  Laterality: Right;  . POLYPECTOMY         Home Medications    Prior to Admission medications   Medication Sig Start Date End Date Taking? Authorizing Provider  ibuprofen (ADVIL,MOTRIN) 200 MG tablet Take 200 mg by mouth every 6 (six) hours as needed for moderate pain.    Yes [provider]  Multiple Vitamin (MULTIVITAMIN WITH MINERALS) TABS  tablet Take 1 tablet by mouth daily as needed (OCCASIONAL).   Yes [provider]  omeprazole (PRILOSEC) 20 MG capsule Take 1 capsule (20 mg total) by mouth daily. Patient taking differently: Take 20 mg by mouth as needed (HEARTBURN).  02/27/15  Yes Chesley Mires, MD  HYDROcodone-acetaminophen [facility]) 5-325 MG tablet 1-2 tabs po q6 hours prn pain Patient not taking: Reported on 10/29/2016 06/26/16   Leanora Cover, MD  HYDROcodone-acetaminophen [facility]) 5-325 MG tablet 1-2 tabs po q6 hours prn pain Patient not taking: Reported on 10/29/2016 10/19/16   Leanora Cover, MD    Family History Family History  Problem Relation Age of Onset  . Prostate cancer Father   . Colon cancer Neg Hx   . Colon polyps Neg Hx   . Rectal cancer Neg Hx   . Stomach cancer Neg Hx   . Esophageal cancer Neg Hx     Social History Social History  Substance Use Topics  . Smoking status: Never Smoker  . Smokeless tobacco: Never Used  . Alcohol use Yes     Comment: occasionally     Allergies   Patient has no known allergies.   Review of Systems Review of Systems  Eyes: Positive for blurred vision and double vision.  All other systems are reviewed and are negative for acute change except as noted in the HPI    Physical Exam Updated Vital Signs BP (!) 119/94   Pulse Marland Kitchen)  56   Temp 97.7 F (36.5 C)   Resp 14   SpO2 97%   Physical Exam  Constitutional: He is oriented to person, place, and time. He appears well-developed and well-nourished. No distress.  HENT:  Head: Normocephalic and atraumatic.  Nose: Nose normal.  Eyes: Conjunctivae and EOM are normal. Pupils are equal, round, and reactive to light. Right eye exhibits no discharge. Left eye exhibits no discharge. No scleral icterus.  Acuity 20/200 bilaterally  Neck: Normal range of motion. Neck supple.  Cardiovascular: Normal rate and regular rhythm.  Exam reveals no gallop and no friction rub.   No murmur heard. Pulmonary/Chest: Effort normal  and breath sounds normal. No stridor. No respiratory distress. He has no rales.  Abdominal: Soft. He exhibits no distension. There is no tenderness.  Musculoskeletal: He exhibits no edema or tenderness.  Neurological: He is alert and oriented to person, place, and time.  Mental Status: Alert and oriented to person, place, and time. Attention and concentration normal. Speech clear. Recent memory is intac  Cranial Nerves  II Visual Fields: Intact to confrontation. Visual fields intact. III, IV, VI: Pupils equal and reactive to light and near. Full eye movement without nystagmus  V Facial Sensation: Normal. No weakness of masticatory muscles  VII: No facial weakness or asymmetry  VIII Auditory Acuity: Grossly normal  IX/X: The uvula is midline; the palate elevates symmetrically  XI: Normal sternocleidomastoid and trapezius strength  XII: The tongue is midline. No atrophy or fasciculations.   Motor System: Muscle Strength: 5/5 and symmetric in the upper and lower extremities. No pronation or drift.  Muscle Tone: Tone and muscle bulk are normal in the upper and lower extremities.   Reflexes: DTRs: 2+ and symmetrical in all four extremities. Plantar responses are flexor bilaterally.  Coordination: Intact finger-to-nose, heel-to-shin. No tremor.  Sensation: Intact to light touch, and pinprick.  Gait: deferred    Skin: Skin is warm and dry. No rash noted. He is not diaphoretic. No erythema.  Psychiatric: He has a normal mood and affect.  Vitals reviewed.    ED Treatments / Results  Labs (all labs ordered are listed, but only abnormal results are displayed) Labs Reviewed  PROTIME-INR  APTT  CBC  DIFFERENTIAL  COMPREHENSIVE METABOLIC PANEL  I-STAT TROPOININ, ED    EKG  EKG Interpretation None       Radiology Ct Head Wo Contrast  Result Date: 10/29/2016 CLINICAL DATA:  Blurred vision EXAM: CT HEAD WITHOUT CONTRAST TECHNIQUE: Contiguous axial images were obtained from the base  of the skull through the vertex without intravenous contrast. COMPARISON:  Nov 15, 2010 FINDINGS: Brain: The ventricles are normal in size and configuration. There is no intracranial mass, hemorrhage, extra-axial fluid collection, or midline shift. Gray-white compartments appear normal. No acute infarct evident. Vascular: No hyperdense vessel. There is slight calcification in the right carotid siphon. Skull: Prominent venous lakes are noted in the right occipital bone, stable. Bony calvarium appears intact. Sinuses/Orbits: There is opacification of multiple ethmoid air cells bilaterally. There is mucosal thickening in both maxillary antra, somewhat more on the right than on the left. There is slight mucosal thickening in the medial right sphenoid sinus. Other visualized paranasal sinuses are clear. No air-fluid levels. There is rightward deviation of the nasal septum. No intraorbital lesions are apparent on either side. Other: Mastoid air cells are clear. IMPRESSION: Multifocal paranasal sinus disease. No intracranial mass, hemorrhage, or extra-axial fluid collection. Gray-white compartments are normal. Slight arteriovascular calcification noted. Deviated  nasal septum. Electronically Signed   By: Lowella Grip III M.D.   On: 10/29/2016 14:26    Procedures Procedures (including critical care time)  Medications Ordered in ED Medications - No data to display   Initial Impression / Assessment and Plan / ED Course  I have reviewed the triage vital signs and the nursing notes.  Pertinent labs & imaging results that were available during my care of the patient were reviewed by me and considered in my medical decision making (see chart for details).     Plan ocular diplopia. Extraocular movements intact. Visual acuity decreased in bilateral eyes. No eye pain to suggest elevated intraocular pressures. No trauma. No other focal deficits noted on exam. However patient is diplopia is not resolved. CT head  negative. We'll obtain MRI to assess for possible CVA versus vascular process such as aneurysm that is impinging on a cranial nerve or carotid dissection.  Discussed case with neurology who felt that this may be likely secondary to cranial nerve palsy but was in agreement with obtaining MRI.  Patient care turned over to Dr Eulis Foster at 1800. Patient case and results discussed in detail; please see their note for further ED managment.      Final Clinical Impressions(s) / ED Diagnoses   Final diagnoses:  Binocular vision disorder with diplopia     Cicilia Clinger, Grayce Sessions, MD 10/29/16 [id number]

## 2016-10-29 NOTE — ED Provider Notes (Signed)
20: 30-MRI results have returned and the patient was interviewed briefly.  We discussed the MRI results and his symptoms.  His visual abnormality, both blurriness and double vision has been intermittent, and not present, since 2 PM today.  He denies any sinus symptoms.  He does admit to fatigue and stress with new job and lots of travel.  MDM-nonspecific visual abnormality, incidental findings of possible brain aneurysm, and sinus disease.  There is no indication for intervention or hospitalization, at this time.  Plan-PCP, as needed   Daleen Bo, MD 10/29/16 2035

## 2016-10-29 NOTE — ED Notes (Signed)
Taken to MRI 

## 2016-10-29 NOTE — ED Triage Notes (Signed)
Pt reports flying this am and had onset of blurred vision at approx 0830. Reports vision seems crossed, clears when he covers one eye. States that the vision change is intermittent, at this time vision is clear. Denies all other neuro symptoms.

## 2016-10-29 NOTE — ED Notes (Signed)
Pt returned from CT °

## 2016-10-29 NOTE — Discharge Instructions (Signed)
Testing today was reassuring, there was no indication of stroke.  There were a couple of abnormalities on the MRI scan, which we discussed.  There is a possible aneurysm, but it is not clear.  There is sinus disease, but your symptoms do not support significant sinus disease.  If your symptoms recur, or become troubling otherwise, follow-up with your primary care doctor.  She could either help you, or refer you to a specialist such as a ophthalmologist, or neurologist, for further evaluation.  I am including the MRI results, copied from the report today:   Result Time Status  10/29/16  7:34 PM Final result     Narrative  CLINICAL DATA:  56 y/o  M; intermittent vision changes.     EXAM:  MR HEAD WITHOUT CONTRAST     MR CIRCLE OF WILLIS WITHOUT CONTRAST     MRA OF THE NECK WITHOUT AND WITH CONTRAST     TECHNIQUE:  Multiplanar, multiecho pulse sequences of the brain, circle of  willis and surrounding structures were obtained without intravenous  contrast. Angiographic images of the neck were obtained using MRA  technique without and with intravenous contrast.     CONTRAST:  47mL MULTIHANCE GADOBENATE DIMEGLUMINE 529 MG/ML IV SOLN     COMPARISON:  10/29/2016 CT head.     FINDINGS:  MR HEAD FINDINGS     Brain: No acute infarction, hemorrhage, hydrocephalus, extra-axial  collection or mass lesion.     Vascular: Normal flow voids.     Skull and upper cervical spine: Normal marrow signal.     Sinuses/Orbits: Patchy ethmoid sinus opacification and moderate  bilateral maxillary sinus mucosal thickening. Retention cyst or  polyp within the right posterior nasal passages measuring 22 mm. No  abnormal signal of mastoid air cells.     Other: None.     MR CIRCLE OF WILLIS FINDINGS     Internal carotid arteries: Patent. 2 mm inferiorly directed  outpouching of the right paraclinoid internal carotid artery may  represent a tiny aneurysm or infundibular origin of diminutive  vessel.  (Series 4, image 76).     Anterior cerebral arteries:  Patent.     Middle cerebral arteries: Patent.     Anterior communicating artery: Patent.     Posterior communicating arteries:  Patent.     Posterior cerebral arteries: Not identified, likely hypoplastic or  absent.     Basilar artery:  Patent.     Vertebral arteries:  Patent.     Otherwise no evidence of high-grade stenosis, large vessel  occlusion, or aneurysm.     MRA NECK FINDINGS     Aortic arch: Standard branching.     Right common carotid artery: Patent.     Right internal carotid artery: Patent.     Right vertebral artery: Patent.     Left common carotid artery: Patent.     Left Internal carotid artery: Patent.     Left Vertebral artery: Patent.     There is no evidence of high-grade stenosis, dissection, or aneurysm  unless noted above.     IMPRESSION:  MRI head:     1. No acute intracranial abnormality.  2. Extensive paranasal sinus disease with a polyp or mucous  retention cyst in right posterior nasal passages.  3. Otherwise unremarkable MRI of the head.  MRA head:     2 mm inferiorly directed outpouching of the right paraclinoid  internal carotid artery may represent a tiny aneurysm or  infundibular origin of diminutive  vessel. Otherwise no large vessel  occlusion, aneurysm, or significant stenosis is identified.     MRA neck:     Normal MRA of the neck. No large vessel occlusion, aneurysm, or  significant stenosis is identified.        Electronically Signed    By: Kristine Garbe M.D.    On: 10/29/2016 19:34

## 2016-10-29 NOTE — ED Notes (Signed)
Papers reviewed with patient and family member and they verbalize understanding and intent to follow up  

## 2016-10-29 NOTE — ED Notes (Signed)
Pt transported to CT ?

## 2017-01-07 NOTE — Anesthesia Postprocedure Evaluation (Signed)
Anesthesia Post Note  Patient: Norman Dunn  Procedure(s) Performed: Procedure(s) (LRB): RIGHT SMALL FINGER Z-PLASTY, PROXIMAL INTERPHALANGEAL JOINT RELEASE (Right)     Anesthesia Post Evaluation  Last Vitals:  Vitals:   10/19/16 1540 10/19/16 1608  BP:  113/73  Pulse: (!) 58 (!) 57  Resp: 18 20  Temp: 36.6 C 36.5 C    Last Pain:  Vitals:   10/20/16 0922  TempSrc:   PainSc: 1                  Israel Wunder EDWARD

## 2017-01-07 NOTE — Addendum Note (Signed)
Addendum  created 01/07/17 1138 by Lyndle Herrlich, MD   Sign clinical note

## 2017-07-14 DIAGNOSIS — B078 Other viral warts: Secondary | ICD-10-CM | POA: Diagnosis not present

## 2017-07-21 ENCOUNTER — Encounter (INDEPENDENT_AMBULATORY_CARE_PROVIDER_SITE_OTHER): Payer: Self-pay | Admitting: Physician Assistant

## 2017-07-21 ENCOUNTER — Ambulatory Visit (INDEPENDENT_AMBULATORY_CARE_PROVIDER_SITE_OTHER): Payer: BLUE CROSS/BLUE SHIELD

## 2017-07-21 ENCOUNTER — Ambulatory Visit (INDEPENDENT_AMBULATORY_CARE_PROVIDER_SITE_OTHER): Payer: BLUE CROSS/BLUE SHIELD | Admitting: Physician Assistant

## 2017-07-21 DIAGNOSIS — M25551 Pain in right hip: Secondary | ICD-10-CM

## 2017-07-21 DIAGNOSIS — M1611 Unilateral primary osteoarthritis, right hip: Secondary | ICD-10-CM

## 2017-07-21 NOTE — Progress Notes (Signed)
Office Visit Note   Patient: Norman Dunn           Date of Birth: 07-24-1960           MRN: [id number] Visit Date: 07/21/2017              Requested by: Kathyrn Lass, [place], [place] [address] PCP: Kathyrn Lass, MD   Assessment & Plan: Visit Diagnoses:  1. Right hip pain     Plan: After going over the radiographs with Norman Dunn and discussing his examination.  Recommend conservative treatment this would be occasional anti-inflammatories and possible intra-articular injection in the right hip if his pain becomes more frequent.  He states that this point time his pain is so infrequent that he would not consider any type of injection.  Therefore we will have him follow-up with [place] on a as needed basis.  He did also ask about the right finger that he had crushed in 2017.  He underwent surgery twice by local hand surgeons for the right fifth finger crush injury he developed fair amount of scar tissue after the first surgery and underwent a second surgery for this finger is flexed at the PIP joint.  He states he is learning to live with this.  This point time do not feel we have anything to offer him as he is a form so much scar tissue despite having a second surgery and undergoing physical therapy.  Questions were encouraged and answered at length.  Follow-Up Instructions: Return if symptoms worsen or fail to improve.   Orders:  Orders Placed This Encounter  Procedures  . XR HIP UNILAT W OR W/O PELVIS 1V RIGHT   No orders of the defined types were placed in this encounter.     Procedures: No procedures performed   Clinical Data: No additional findings.   Subjective: Chief Complaint  Patient presents with  . Right Hip - Pain    HPI Norman Dunn is a 57 year old male who comes in today due to right hip pain.  States pain is been ongoing for the past 1-3 years.  He has deep groin pain.  He does have difficulty donning shoes and socks but is modified bowling on  the right shoe and sock to the point that he does not have as much pain in the head.  He has occasional pain with getting out of a car in the right hip.  No waking pain he does not use any assistive devices.  He is tried no medications for this.  Pain is worse at night. Review of Systems No fevers chills shortness breath chest pain otherwise please see HPI.  Objective: Vital Signs: There were no vitals taken for this visit.  Physical Exam  Constitutional: He is oriented to person, place, and time. He appears well-developed and well-nourished. No distress.  Cardiovascular: Intact distal pulses.  Pulmonary/Chest: Effort normal.  Neurological: He is alert and oriented to person, place, and time.  Skin: He is not diaphoretic.  Psychiatric: He has a normal mood and affect. His behavior is normal.    Ortho Exam He has good external rotation of bilateral hips internal rotation is limited bilaterally but is more severely pronounced on right.  There is no trochanteric pain.  Internal and external rotation of both hips causes no significant pain .  Ambulates without any assistive device. Specialty Comments:  No specialty comments available.  Imaging: Xr Hip Unilat W Or W/o Pelvis 1v Right  Result Date: 07/21/2017 AP pelvis lateral view of the right hip: Both hips are well located acetabulum is however shallow.  Has impingement signs both hips.  Osteophyte right hip of the inferior femoral head.  No acute fractures.  Moderate narrowing of the right hip joint.    PMFS History: Patient Active Problem List   Diagnosis Date Noted  . GERD (gastroesophageal reflux disease) 07/12/2013  . OSA (obstructive sleep apnea) 09/07/2012   Past Medical History:  Diagnosis Date  . GERD (gastroesophageal reflux disease)   . High cholesterol   . OSA (obstructive sleep apnea)   . Sleep apnea    cpap every night    Family History  Problem Relation Age of Onset  . Prostate cancer Father   . Colon cancer  Neg Hx   . Colon polyps Neg Hx   . Rectal cancer Neg Hx   . Stomach cancer Neg Hx   . Esophageal cancer Neg Hx     Past Surgical History:  Procedure Laterality Date  . COLONOSCOPY    . DUPUYTREN CONTRACTURE RELEASE Right 10/19/2016   Procedure: RIGHT SMALL FINGER Z-PLASTY, PROXIMAL INTERPHALANGEAL JOINT RELEASE;  Surgeon: Leanora Cover, MD;  Location: [facility];  Service: Orthopedics;  Laterality: Right;  axillary block in preop  . ESOPHAGEAL DILATION    . INGUINAL HERNIA REPAIR     right side  . PERCUTANEOUS PINNING Right 06/26/2016   Procedure: RIGHT SMALL FINGER IRRIGATION AND DEBRIDEMENT, POSSIBLE PINNING OF RIGHT SMALL FINGER FRACTURE;  Surgeon: Leanora Cover, MD;  Location: [facility];  Service: Orthopedics;  Laterality: Right;  . POLYPECTOMY     Social History   Occupational History  . Occupation: Scientist, clinical (histocompatibility and immunogenetics): [employer]  Tobacco Use  . Smoking status: Never Smoker  . Smokeless tobacco: Never Used  Substance and Sexual Activity  . Alcohol use: Yes    Comment: occasionally  . Drug use: No  . Sexual activity: Not on file

## 2017-07-27 DIAGNOSIS — G4733 Obstructive sleep apnea (adult) (pediatric): Secondary | ICD-10-CM | POA: Diagnosis not present

## 2018-02-16 ENCOUNTER — Ambulatory Visit (INDEPENDENT_AMBULATORY_CARE_PROVIDER_SITE_OTHER): Payer: BLUE CROSS/BLUE SHIELD | Admitting: Orthopaedic Surgery

## 2018-02-16 ENCOUNTER — Encounter (INDEPENDENT_AMBULATORY_CARE_PROVIDER_SITE_OTHER): Payer: Self-pay | Admitting: Orthopaedic Surgery

## 2018-02-16 DIAGNOSIS — M1611 Unilateral primary osteoarthritis, right hip: Secondary | ICD-10-CM

## 2018-02-16 DIAGNOSIS — M25851 Other specified joint disorders, right hip: Secondary | ICD-10-CM | POA: Diagnosis not present

## 2018-02-16 DIAGNOSIS — M25551 Pain in right hip: Secondary | ICD-10-CM | POA: Insufficient documentation

## 2018-02-16 HISTORY — DX: Unilateral primary osteoarthritis, right hip: M16.11

## 2018-02-16 NOTE — Progress Notes (Signed)
The patient is a very pleasant 57 year old gentleman who saw back in January of this year.  He is having worsening right hip pain and stiffness.  In January x-rays were obtained and it did show some evidence of femoral acetabular impingement.  I re-reviewed his x-rays today and see certainly significant para-articular osteophytes at the acetabulum and the inferior femoral head but also see a hump neck deformity consistent with femoral acetabular impingement.  On exam of his hips today his left hip has pretty good internal and external rotation but the right hip is significantly stiff with attempts of internal or external rotation.  He cannot put on a shoe or sock on that side and cannot cross his leg.  He does have significant pain in the groin on exam as well.  At this point I am certainly concerned about the cartilage in his right hip in general.  He said his pain can be 10 out of 10 and this point it is detrimentally affected his activities daily living, his quality of life, and his mobility.  I do feel an MRI is thus warranted to further assess the cartilage in his right hip so we can come up with appropriate treatment plan.  We will see him back after the MRI of his right hip.

## 2018-02-21 DIAGNOSIS — L821 Other seborrheic keratosis: Secondary | ICD-10-CM | POA: Diagnosis not present

## 2018-02-21 DIAGNOSIS — D1800 Hemangioma unspecified site: Secondary | ICD-10-CM | POA: Diagnosis not present

## 2018-02-21 DIAGNOSIS — Z85828 Personal history of other malignant neoplasm of skin: Secondary | ICD-10-CM | POA: Diagnosis not present

## 2018-02-21 DIAGNOSIS — L814 Other melanin hyperpigmentation: Secondary | ICD-10-CM | POA: Diagnosis not present

## 2018-03-02 ENCOUNTER — Ambulatory Visit
Admission: RE | Admit: 2018-03-02 | Discharge: 2018-03-02 | Disposition: A | Discharge status: Home / Self Care | Payer: BLUE CROSS/BLUE SHIELD | Source: Ambulatory Visit | Attending: Orthopaedic Surgery | Admitting: Orthopaedic Surgery

## 2018-03-02 DIAGNOSIS — M25551 Pain in right hip: Secondary | ICD-10-CM

## 2018-03-06 ENCOUNTER — Ambulatory Visit (INDEPENDENT_AMBULATORY_CARE_PROVIDER_SITE_OTHER): Payer: BLUE CROSS/BLUE SHIELD | Admitting: Orthopaedic Surgery

## 2018-04-12 IMAGING — MR MR MRA HEAD W/O CM
11 of 18 series · 23 of 48 positions shown · IV contrast (multihance)
Comparison: 10/29/2016 CT head.

CLINICAL DATA: 55 y/o  M; intermittent vision changes.

EXAM:
MR HEAD WITHOUT CONTRAST
MR CIRCLE OF WILLIS WITHOUT CONTRAST
MRA OF THE NECK WITHOUT AND WITH CONTRAST
TECHNIQUE: Multiplanar, multiecho pulse sequences of the brain, circle of
willis and surrounding structures were obtained without intravenous
contrast. Angiographic images of the neck were obtained using MRA
technique without and with intravenous contrast.
CONTRAST:  19mL MULTIHANCE GADOBENATE DIMEGLUMINE 529 MG/ML IV SOLN

[Series 3: DWI · axial · 3.0mm · 1.09mm/px · z∈[-27,+102]mm · 3 of 88 slices shown (1 of 4)]
[im 1/88]
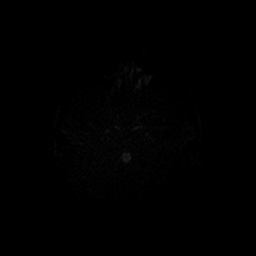
[im 44/88]
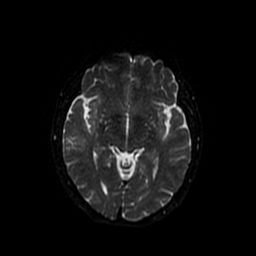
[im 88/88]
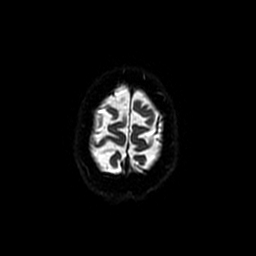

[Series 4: (id) mt fs · axial · 1.4mm · 0.43mm/px · z∈[-39,+55]mm · 5 of 136 slices shown]
[im 1/136]
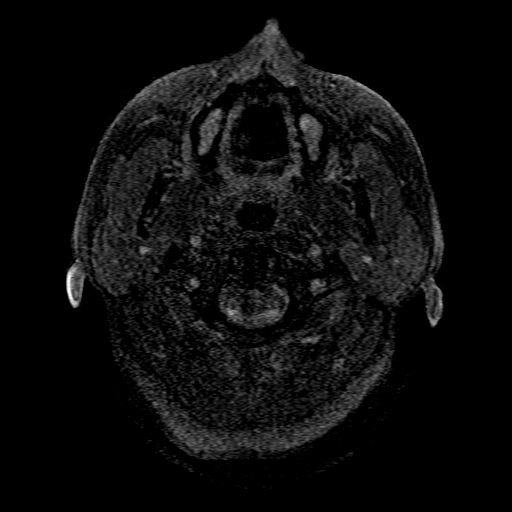
[im 34/136]
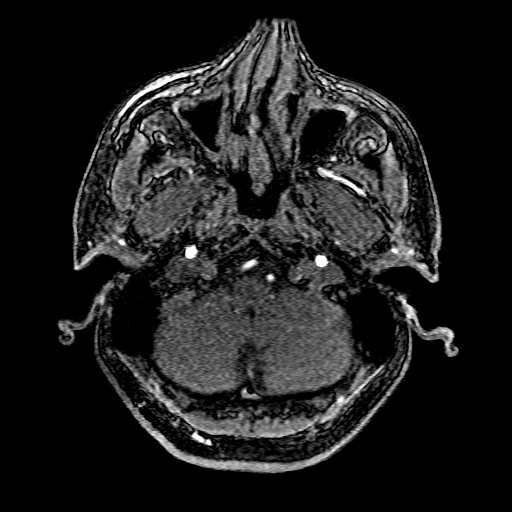
[im 68/136]
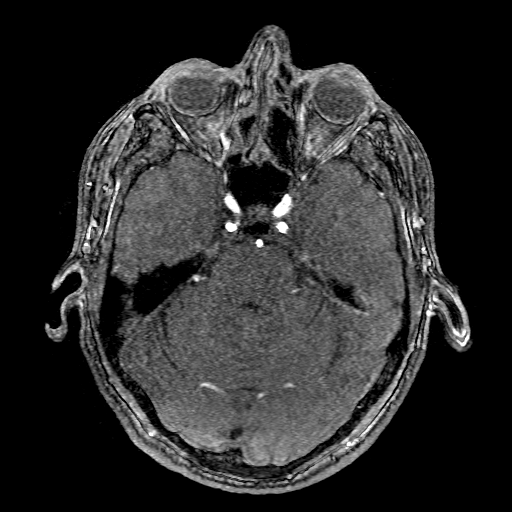
[im 102/136]
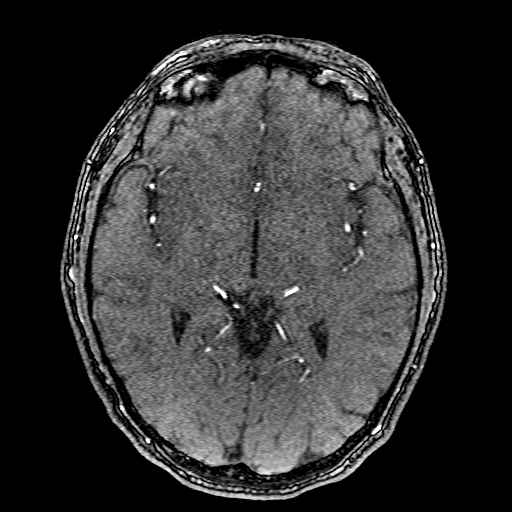
[im 136/136]
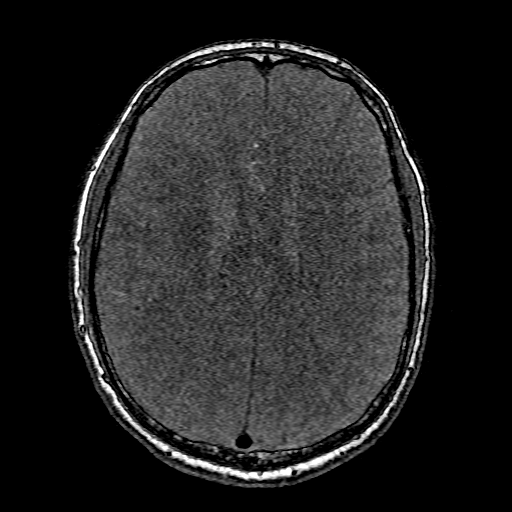

[Series 5: DWI · coronal · 5.0mm · 1.09mm/px · 3 of 72 slices shown (2 of 4)]
[im 1/72]
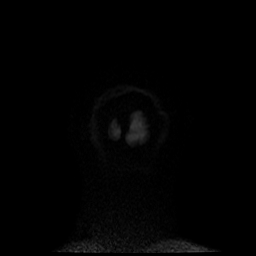
[im 36/72]
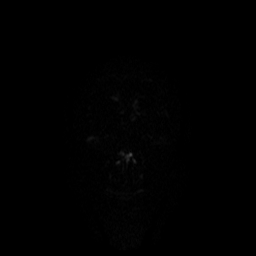
[im 72/72]
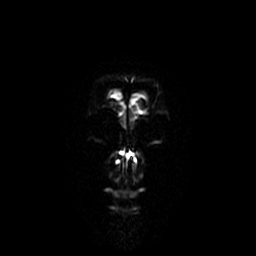

[Series 6: T2 · axial · 5.0mm · 0.47mm/px · 1 of 22 slices shown (1 of 2)]
[im 1/22]
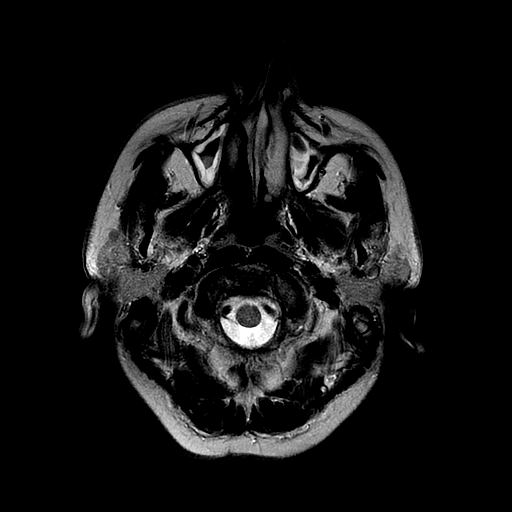

[Series 7: ax mpgr · axial · 5.0mm · 0.47mm/px · 1 of 22 slices shown]
[im 1/22]
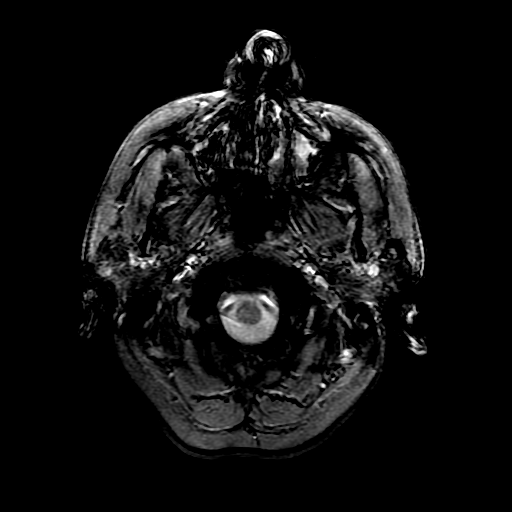

[Series 8: FLAIR · axial · 5.0mm · 0.47mm/px · 1 of 22 slices shown]
[im 1/22]
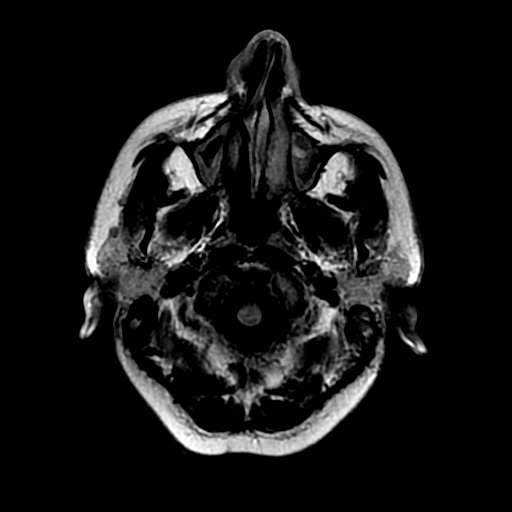

[Series 9: T1 · sagittal · 5.0mm · 0.47mm/px · 1 of 25 slices shown]
[im 1/25]
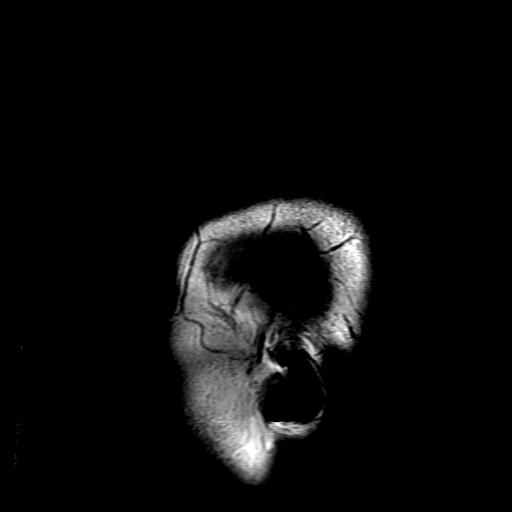

[Series 11: T2 · coronal · 5.0mm · 0.43mm/px · 1 of 30 slices shown (2 of 2)]
[im 1/30]
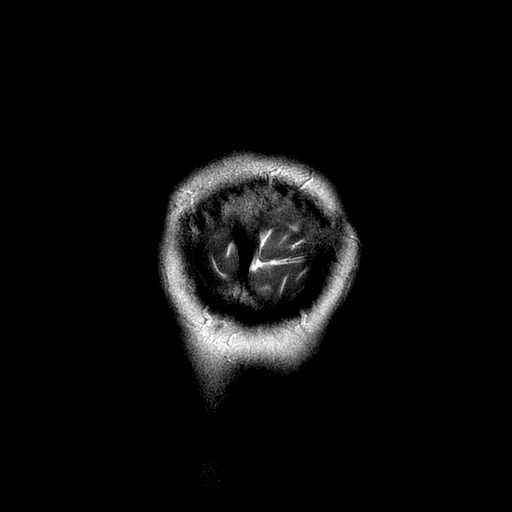

[Series 14: ax (id) · axial · 2.8mm · 0.47mm/px · z∈[-140,-42]mm · 3 of 72 slices shown]
[im 1/72]
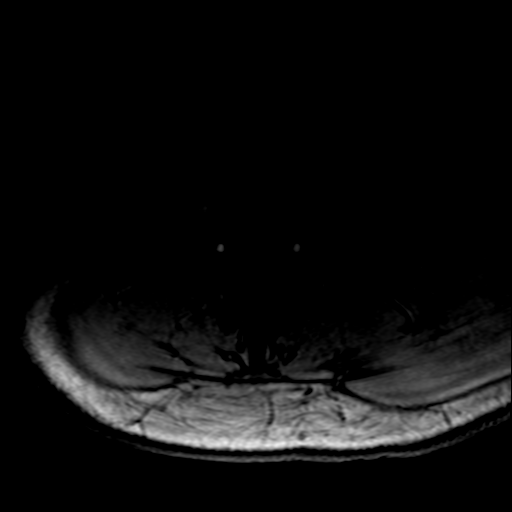
[im 36/72]
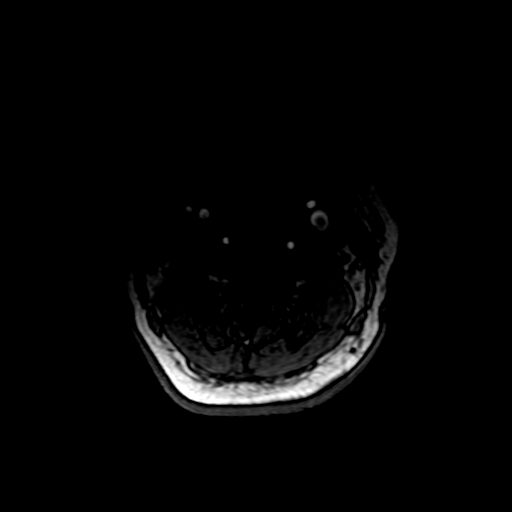
[im 72/72]
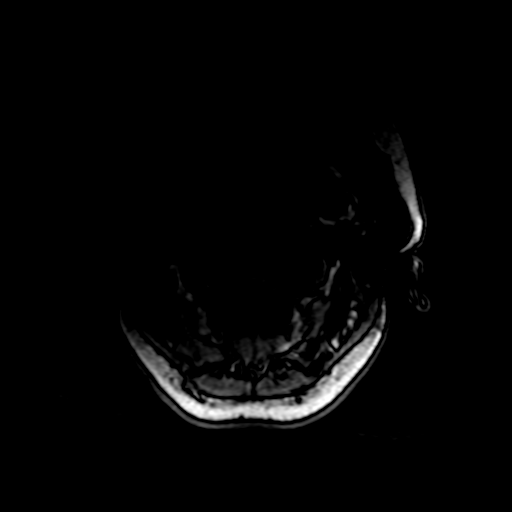

[Series 300: DWI · axial · 3.0mm · 1.09mm/px · z∈[-27,+102]mm · 2 of 44 slices shown (3 of 4)]
[im 1/44]
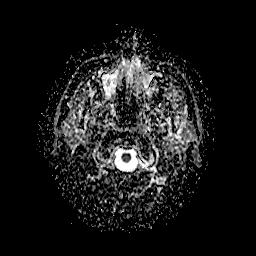
[im 44/44]
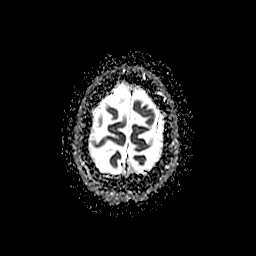

[Series 500: DWI · coronal · 5.0mm · 1.09mm/px · 2 of 36 slices shown (4 of 4)]
[im 1/36]
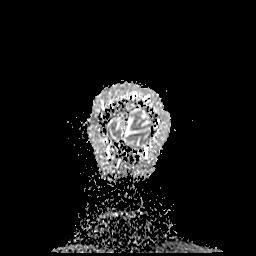
[im 36/36]
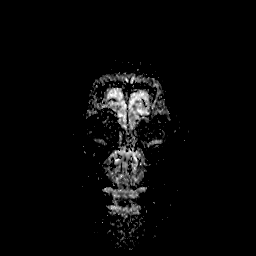

[23 of 48 positions shown; findings below may reference images not displayed]

FINDINGS: MR HEAD FINDINGS

Brain: No acute infarction, hemorrhage, hydrocephalus, extra-axial
collection or mass lesion.

Vascular: Normal flow voids.

Skull and upper cervical spine: Normal marrow signal.

Sinuses/Orbits: Patchy ethmoid sinus opacification and moderate
bilateral maxillary sinus mucosal thickening. Retention cyst or
polyp within the right posterior nasal passages measuring 22 mm. No
abnormal signal of mastoid air cells.

Other: None.

MR CIRCLE OF WILLIS FINDINGS

Internal carotid arteries: Patent. 2 mm inferiorly directed
outpouching of the right paraclinoid internal carotid artery may
represent a tiny aneurysm or infundibular origin of diminutive
vessel. (Series 4, image 76).

Anterior cerebral arteries:  Patent.

Middle cerebral arteries: Patent.

Anterior communicating artery: Patent.

Posterior communicating arteries:  Patent.

Posterior cerebral arteries: Not identified, likely hypoplastic or
absent.

Basilar artery:  Patent.

Vertebral arteries:  Patent.

Otherwise no evidence of high-grade stenosis, large vessel
occlusion, or aneurysm.

MRA NECK FINDINGS

Aortic arch: Standard branching.

Right common carotid artery: Patent.

Right internal carotid artery: Patent.

Right vertebral artery: Patent.

Left common carotid artery: Patent.

Left Internal carotid artery: Patent.

Left Vertebral artery: Patent.

There is no evidence of high-grade stenosis, dissection, or aneurysm
unless noted above.
IMPRESSION: MRI head:

1. No acute intracranial abnormality.
2. Extensive paranasal sinus disease with a polyp or mucous
retention cyst in right posterior nasal passages.
3. Otherwise unremarkable MRI of the head.
MRA head:

2 mm inferiorly directed outpouching of the right paraclinoid
internal carotid artery may represent a tiny aneurysm or
infundibular origin of diminutive vessel. Otherwise no large vessel
occlusion, aneurysm, or significant stenosis is identified.

MRA neck:

Normal MRA of the neck. No large vessel occlusion, aneurysm, or
significant stenosis is identified.

By: Ifeoluwa Tex M.D.

## 2018-04-27 ENCOUNTER — Encounter (INDEPENDENT_AMBULATORY_CARE_PROVIDER_SITE_OTHER): Payer: Self-pay | Admitting: Physician Assistant

## 2018-04-27 ENCOUNTER — Ambulatory Visit (INDEPENDENT_AMBULATORY_CARE_PROVIDER_SITE_OTHER): Payer: BLUE CROSS/BLUE SHIELD | Admitting: Physician Assistant

## 2018-04-27 DIAGNOSIS — M160 Bilateral primary osteoarthritis of hip: Secondary | ICD-10-CM

## 2018-04-27 NOTE — Progress Notes (Signed)
HPI: Mr. Norman Dunn returns today to go over the MRI of his right hip.  He continues to have some pain in his right hip but it does not bother him constantly.  Does have stiffness in both hips. MRI scan is reviewed with the patient and his wife today and shows severe right hip arthritic changes with tearing of the labrum.  Moderate left hip osteoarthritis.  No acute fractures. Patient states at this point time that he just has periodic pain is not ready for any type of surgery.  However he would like to know about the implants and the postoperative recovery.  Impression: Bilateral hip osteoarthritis end-stage right hip  Plan: Discussed with patient at length today the surgical procedure and a handout on hip surgery was given.  Questions were encouraged and answered at length.  Risk including but not limited to blood loss, PE/DVT, wound healing problems, infection hardware failure prolonged pain worsening pain all discussed at length.  This point time he like to wait until the pain gets worse.  He will take periodic anti-inflammatories and modify his activities as he needs depending upon his pain.  He will follow-up with [place] on as-needed basis.

## 2018-05-18 ENCOUNTER — Other Ambulatory Visit: Payer: Self-pay

## 2018-05-18 ENCOUNTER — Encounter: Payer: Self-pay | Admitting: Family Medicine

## 2018-05-18 ENCOUNTER — Ambulatory Visit: Payer: BLUE CROSS/BLUE SHIELD | Admitting: Family Medicine

## 2018-05-18 VITALS — BP 124/82 | HR 72 | Temp 97.4°F | Resp 18 | Ht 70.0 in | Wt 208.0 lb

## 2018-05-18 DIAGNOSIS — Z0000 Encounter for general adult medical examination without abnormal findings: Secondary | ICD-10-CM

## 2018-05-18 DIAGNOSIS — D369 Benign neoplasm, unspecified site: Secondary | ICD-10-CM | POA: Insufficient documentation

## 2018-05-18 DIAGNOSIS — K219 Gastro-esophageal reflux disease without esophagitis: Secondary | ICD-10-CM | POA: Diagnosis not present

## 2018-05-18 DIAGNOSIS — Z1159 Encounter for screening for other viral diseases: Secondary | ICD-10-CM

## 2018-05-18 DIAGNOSIS — Z23 Encounter for immunization: Secondary | ICD-10-CM

## 2018-05-18 HISTORY — DX: Benign neoplasm, unspecified site: D36.9

## 2018-05-18 LAB — CBC WITH DIFFERENTIAL/PLATELET
BASOS PCT: 0.9 % (ref 0.0–3.0)
Basophils Absolute: 0 10*3/uL (ref 0.0–0.1)
EOS ABS: 0.2 10*3/uL (ref 0.0–0.7)
EOS PCT: 3.9 % (ref 0.0–5.0)
HEMATOCRIT: 42.9 % (ref 39.0–52.0)
HEMOGLOBIN: 15.3 g/dL (ref 13.0–17.0)
LYMPHS PCT: 30.1 % (ref 12.0–46.0)
Lymphs Abs: 1.6 10*3/uL (ref 0.7–4.0)
MCHC: 35.6 g/dL (ref 30.0–36.0)
MCV: 90.5 fl (ref 78.0–100.0)
MONOS PCT: 11 % (ref 3.0–12.0)
Monocytes Absolute: 0.6 10*3/uL (ref 0.1–1.0)
Neutro Abs: 2.9 10*3/uL (ref 1.4–7.7)
Neutrophils Relative %: 54.1 % (ref 43.0–77.0)
Platelets: 242 10*3/uL (ref 150.0–400.0)
RBC: 4.74 Mil/uL (ref 4.22–5.81)
RDW: 12.1 % (ref 11.5–15.5)
WBC: 5.4 10*3/uL (ref 4.0–10.5)

## 2018-05-18 LAB — COMPREHENSIVE METABOLIC PANEL
ALT: 28 U/L (ref 0–53)
AST: 18 U/L (ref 0–37)
Albumin: 4.4 g/dL (ref 3.5–5.2)
Alkaline Phosphatase: 80 U/L (ref 39–117)
BUN: 17 mg/dL (ref 6–23)
CHLORIDE: 103 meq/L (ref 96–112)
CO2: 26 mEq/L (ref 19–32)
CREATININE: 0.84 mg/dL (ref 0.40–1.50)
Calcium: 9.5 mg/dL (ref 8.4–10.5)
GFR: 100.32 mL/min (ref >60.00)
Glucose, Bld: 99 mg/dL (ref 70–99)
Potassium: 4.1 mEq/L (ref 3.5–5.1)
Sodium: 137 mEq/L (ref 135–145)
Total Bilirubin: 0.6 mg/dL (ref 0.2–1.2)
Total Protein: 7.1 g/dL (ref 6.0–8.3)

## 2018-05-18 LAB — LIPID PANEL
CHOLESTEROL: 238 mg/dL — AB (ref 0–200)
HDL: 46.1 mg/dL (ref >39.00)
LDL CALC: 154 mg/dL — AB (ref 0–99)
NonHDL: 192.13
TRIGLYCERIDES: 191 mg/dL — AB (ref 0.0–149.0)
Total CHOL/HDL Ratio: 5
VLDL: 38.2 mg/dL (ref 0.0–40.0)

## 2018-05-18 MED ORDER — OMEPRAZOLE 20 MG PO CPDR: 20.0000 mg | DELAYED_RELEASE_CAPSULE | Freq: Every day | ORAL | 3 refills | Status: DC

## 2018-05-18 MED ORDER — VALACYCLOVIR HCL 500 MG PO TABS: 500.0000 mg | ORAL_TABLET | Freq: Two times a day (BID) | ORAL | 5 refills | Status: DC

## 2018-05-18 NOTE — Patient Instructions (Signed)
Please return in 12 months for your annual complete physical; please come fasting.   It was a pleasure meeting you today! Thank you for choosing [place] to meet your healthcare needs! I truly look forward to working with you. If you have any questions or concerns, please send me a message via Mychart or call the office at [phone].  Please do these things to maintain good health!   Exercise at least 30-45 minutes a day,  4-5 days a week.   Eat a low-fat diet with lots of fruits and vegetables, up to 7-9 servings per day.  Drink plenty of water daily. Try to drink 8 8oz glasses per day.  Seatbelts can save your life. Always wear your seatbelt.  Place Smoke Detectors on every level of your home and check batteries every year.  Eye Doctor - have an eye exam every 1-2 years  Safe sex - use condoms to protect yourself from STDs if you could be exposed to these types of infections.  Avoid heavy alcohol use. If you drink, keep it to less than 2 drinks/day and not every day.  [place].  Choose someone you trust that could speak for you if you became unable to speak for yourself.  Depression is common in our stressful world.If you're feeling down or losing interest in things you normally enjoy, please come in for a visit.   Fat and Cholesterol Restricted Diet Getting too much fat and cholesterol in your diet may cause health problems. Following this diet helps keep your fat and cholesterol at normal levels. This can keep you from getting sick. What types of fat should I choose?  Choose monosaturated and polyunsaturated fats. These are found in foods such as olive oil, canola oil, flaxseeds, walnuts, almonds, and seeds.  Eat more omega-3 fats. Good choices include salmon, mackerel, sardines, tuna, flaxseed oil, and ground flaxseeds.  Limit saturated fats. These are in animal products such as meats, butter, and cream. They can also be in plant products such as palm oil,  palm kernel oil, and coconut oil.  Avoid foods with partially hydrogenated oils in them. These contain trans fats. Examples of foods that have trans fats are stick margarine, some tub margarines, cookies, crackers, and other baked goods. What general guidelines do I need to follow?  Check food labels. Look for the words "trans fat" and "saturated fat."  When preparing a meal: ? Fill half of your plate with vegetables and green salads. ? Fill one fourth of your plate with whole grains. Look for the word "whole" as the first word in the ingredient list. ? Fill one fourth of your plate with lean protein foods.  Eat more foods that have fiber, like apples, carrots, beans, peas, and barley.  Eat more home-cooked foods. Eat less at restaurants and buffets.  Limit or avoid alcohol.  Limit foods high in starch and sugar.  Limit fried foods.  Cook foods without frying them. Baking, boiling, grilling, and broiling are all great options.  Lose weight if you are overweight. Losing even a small amount of weight can help your overall health. It can also help prevent diseases such as diabetes and heart disease. What foods can I eat? Grains Whole grains, such as whole wheat or whole grain breads, crackers, cereals, and pasta. Unsweetened oatmeal, bulgur, barley, quinoa, or brown rice. Corn or whole wheat flour tortillas. Vegetables Fresh or frozen vegetables (raw, steamed, roasted, or grilled). Green salads. Fruits All fresh, canned (in  natural juice), or frozen fruits. Meat and Other Protein Products Ground beef (85% or leaner), grass-fed beef, or beef trimmed of fat. Skinless chicken or [place]. Ground chicken or [place]. Pork trimmed of fat. All fish and seafood. Eggs. Dried beans, peas, or lentils. Unsalted nuts or seeds. Unsalted canned or dry beans. Dairy Low-fat dairy products, such as skim or 1% milk, 2% or reduced-fat cheeses, low-fat ricotta or cottage cheese, or plain low-fat  yogurt. Fats and Oils Tub margarines without trans fats. Light or reduced-fat mayonnaise and salad dressings. Avocado. Olive, canola, sesame, or safflower oils. Natural peanut or almond butter (choose ones without added sugar and oil). The items listed above may not be a complete list of recommended foods or beverages. Contact your dietitian for more options. What foods are not recommended? Grains White bread. White pasta. White rice. Cornbread. Bagels, pastries, and croissants. Crackers that contain trans fat. Vegetables White potatoes. Corn. Creamed or fried vegetables. Vegetables in a cheese sauce. Fruits Dried fruits. Canned fruit in light or heavy syrup. Fruit juice. Meat and Other Protein Products Fatty cuts of meat. Ribs, chicken wings, bacon, sausage, bologna, salami, chitterlings, fatback, hot dogs, bratwurst, and packaged luncheon meats. Liver and organ meats. Dairy Whole or 2% milk, cream, half-and-half, and cream cheese. Whole milk cheeses. Whole-fat or sweetened yogurt. Full-fat cheeses. Nondairy creamers and whipped toppings. Processed cheese, cheese spreads, or cheese curds. Sweets and Desserts Corn syrup, sugars, honey, and molasses. Candy. Jam and jelly. Syrup. Sweetened cereals. Cookies, pies, cakes, donuts, muffins, and ice cream. Fats and Oils Butter, stick margarine, lard, shortening, ghee, or bacon fat. Coconut, palm kernel, or palm oils. Beverages Alcohol. Sweetened drinks (such as sodas, lemonade, and fruit drinks or punches). The items listed above may not be a complete list of foods and beverages to avoid. Contact your dietitian for more information. This information is not intended to replace advice given to you by your health care provider. Make sure you discuss any questions you have with your health care provider. Document Released: 12/14/2011 Document Revised: 02/19/2016 Document Reviewed: 09/13/2013 Elsevier Interactive Patient Education  2018 [employer].   Please read the patient education material about [facility] [place].    Both the Living Will and [place] require a Insurance account manager to [employer] on either document.  You may complete just one or both or neither of the documents. It is voluntaary. However, it can be very helpful if needed and I strongly encourage you to complete both forms stating your Advanced Directives. This will help me and your other healthcare providers care for you.  If you choose to complete either or both documents, please bring in or mail copies of the completed documents to my office so that I may put them in your medical chart. This way will ensure we have them if needed.

## 2018-05-18 NOTE — Progress Notes (Signed)
Subjective  Chief Complaint  Patient presents with  . Establish Care   HPI: Norman Dunn is a 57 y.o. male who presents to [place] at [facility] today for a Male Wellness Visit.  He is a new patient to establish care today.  Has not had prior PCP in many years.  I did review his records from orthopedics  Wellness Visit: annual visit with health maintenance review and exam    Overall, very healthy happily married 57 year old male with right end-stage hip arthritis.  He has no major concerns.  Would like to lose weight.  We discussed his diet in detail.  Travels to out of every 4 weeks so eating out can be difficult.  Tends to be a late night snacker.  Eats "everything".  Tends to keep active but does not work out per se.  He is been overweight for the last 5 to 10 years.  He would like to be 195 pounds.  He does not drink sweetened beverages.  He does not have a big sweet tooth.  Past history is significant for adenomatous colon polyps.  He does have a gastroenterologist and colonoscopy screens are up-to-date.  He has history of genital herpes with outbreaks once to twice yearly.  He would like a refill of his Valtrex to have for recurrences. Lifestyle: Body mass index is 29.84 kg/m. Wt Readings from Last 3 Encounters:  05/18/18 208 lb (94.3 kg)  10/19/16 199 lb (90.3 kg)  06/26/16 199 lb (90.3 kg)    Patient Active Problem List   Diagnosis Date Noted  . Adenomatous polyps 05/18/2018  . Femoroacetabular impingement of right hip 02/16/2018  . Unilateral primary osteoarthritis, right hip 02/16/2018  . Genital herpes 05/06/2014  . Gastroesophageal reflux disease without esophagitis 07/12/2013  . OSA (obstructive sleep apnea) 09/07/2012   Health Maintenance  Topic Date Due  . Hepatitis C Screening  June 19, 1961  . HIV Screening  12/25/1976  . COLONOSCOPY  02/22/2021  . TETANUS/TDAP  05/18/2028  . INFLUENZA VACCINE  Completed   Immunization History    Administered Date(s) Administered  . Influenza Whole 03/28/2012, 03/28/2013  . Influenza,inj,Quad PF,6+ Mos 04/20/2018  . Influenza-Unspecified 03/28/2014, 05/06/2014  . Tdap 05/18/2018   We updated and reviewed the patient's past history in detail and it is documented below. Allergies: Patient has No Known Allergies. Past Medical History  has a past medical history of Adenomatous polyps (05/18/2018), GERD (gastroesophageal reflux disease), High cholesterol, OSA (obstructive sleep apnea), and Sleep apnea. Past Surgical History  has a past surgical history that includes Inguinal hernia repair; Colonoscopy; Polypectomy; Percutaneous pinning (Right, 06/26/2016); Esophageal dilation; and Dupuytren contracture release (Right, 10/19/2016). Social History Patient  reports that he has never smoked. He has never used smokeless tobacco. He reports that he drinks alcohol. He reports that he does not use drugs. Family History Patient family history includes Leukemia (age of onset: 2) in his son; Prostate cancer in his father. Review of Systems: Constitutional: negative for fever or malaise Ophthalmic: negative for photophobia, double vision or loss of vision Cardiovascular: negative for chest pain, dyspnea on exertion, or new LE swelling Respiratory: negative for SOB or persistent cough Gastrointestinal: negative for abdominal pain, change in bowel habits or melena Genitourinary: negative for dysuria or gross hematuria Musculoskeletal: negative for new gait disturbance or muscular weakness Integumentary: negative for new or persistent rashes, no breast lumps Neurological: negative for TIA or stroke symptoms Psychiatric: negative for SI or delusions Allergic/Immunologic: negative for hives  Patient Care Team    Relationship Specialty Notifications Start End  Leamon Arnt, MD PCP - General Family Medicine  05/18/18   Milus Banister, MD Attending Physician Gastroenterology  05/18/18   Otelia Sergeant, OD Referring Physician Optometry  05/18/18    Objective  Vitals: BP 124/82   Pulse 72   Temp (!) 97.4 F (36.3 C) (Oral)   Resp 18   Ht 5\' 10"  (1.778 m)   Wt 208 lb (94.3 kg)   SpO2 97%   BMI 29.84 kg/m  General:  Well developed, well nourished, no acute distress  Psych:  Alert and orientedx3,normal mood and affect HEENT:  Normocephalic, atraumatic, non-icteric sclera, PERRL, oropharynx is clear without mass or exudate, supple neck without adenopathy, mass or thyromegaly Cardiovascular:  Normal S1, S2, RRR without gallop, rub or murmur, nondisplaced PMI, +2 distal pulses in bilateral upper and lower extremities. Respiratory:  Good breath sounds bilaterally, CTAB with normal respiratory effort Gastrointestinal: normal bowel sounds, soft, non-tender, no noted masses. No HSM MSK: no deformities, contusions. Joints are without erythema or swelling. Spine and CVA region are nontender Skin:  Warm, no rashes or suspicious lesions noted Neurologic:    Mental status is normal. CN 2-11 are normal. Gross motor and sensory exams are normal. Stable gait. No tremor GU: No inguinal hernias or adenopathy are appreciated bilaterally  Assessment  1. Annual physical exam   2. Need for hepatitis C screening test   3. Adenomatous polyps   4. Gastroesophageal reflux disease without esophagitis   5. Need for vaccination      Plan  Male Wellness Visit:  Age appropriate Health Maintenance and Prevention measures were discussed with patient. Included topics are cancer screening recommendations, ways to keep healthy (see AVS) including dietary and exercise recommendations, regular eye and dental care, use of seat belts, and avoidance of moderate alcohol use and tobacco use.   BMI: discussed patient's BMI and encouraged positive lifestyle modifications to help get to or maintain a target BMI. Discussed positive dietary changes for weight loss.   HM needs and immunizations were addressed and  ordered. See below for orders. See HM and immunization section for updates.  Routine labs and screening tests ordered including cmp, cbc and lipids where appropriate.  Discussed recommendations regarding Vit D and calcium supplementation (see AVS)  Rare gerd on prn meds.   Follow up: Return in about 1 year (around 05/19/2019) for complete physical.   Commons side effects, risks, benefits, and alternatives for medications and treatment plan prescribed today were discussed, and the patient expressed understanding of the given instructions. Patient is instructed to call or message via MyChart if he/she has any questions or concerns regarding our treatment plan. No barriers to understanding were identified. We discussed Red Flag symptoms and signs in detail. Patient expressed understanding regarding what to do in case of urgent or emergency type symptoms.   Medication list was reconciled, printed and provided to the patient in AVS. Patient instructions and summary information was reviewed with the patient as documented in the AVS. This note was prepared with assistance of Dragon voice recognition software. Occasional wrong-word or sound-a-like substitutions may have occurred due to the inherent limitations of voice recognition software  Orders Placed This Encounter  Procedures  . Tdap vaccine greater than or equal to 7yo IM  . CBC with Differential/Platelet  . Comprehensive metabolic panel  . Lipid panel  . HIV Antibody (routine testing w rflx)  . Hepatitis C antibody  Meds ordered this encounter  Medications  . valACYclovir (VALTREX) 500 MG tablet    Sig: Take 1 tablet (500 mg total) by mouth 2 (two) times daily for 3 days. As needed for outbreaks    Dispense:  30 tablet    Refill:  5  . omeprazole (PRILOSEC) 20 MG capsule    Sig: Take 1 capsule (20 mg total) by mouth daily.    Dispense:  90 capsule    Refill:  3

## 2018-05-19 ENCOUNTER — Encounter: Payer: Self-pay | Admitting: Family Medicine

## 2018-05-19 LAB — HEPATITIS C ANTIBODY
Hepatitis C Ab: NONREACTIVE
SIGNAL TO CUT-OFF: 0.04 (ref <1.00)

## 2018-05-19 LAB — HIV ANTIBODY (ROUTINE TESTING W REFLEX): HIV: NONREACTIVE

## 2018-05-19 NOTE — Progress Notes (Signed)
Please call patient: I have reviewed his/her lab results. Lab tests look fine except cholesterol is elevated. Eating better as we discussed will improve this and will prevent the need to start cholesterol lowering medication.  The 10-year ASCVD risk score Mikey Bussing DC Brooke Bonito., et al., 2013) is: 7.5%   Values used to calculate the score:     Age: 57 years     Sex: Male     Is Non-Hispanic African American: No     Diabetic: No     Tobacco smoker: No     Systolic Blood Pressure: 832 mmHg     Is BP treated: No     HDL Cholesterol: 46.1 mg/dL     Total Cholesterol: 238 mg/dL

## 2019-01-02 ENCOUNTER — Other Ambulatory Visit: Payer: Self-pay

## 2019-01-02 ENCOUNTER — Ambulatory Visit (INDEPENDENT_AMBULATORY_CARE_PROVIDER_SITE_OTHER): Payer: BC Managed Care – PPO | Admitting: Orthopaedic Surgery

## 2019-01-02 DIAGNOSIS — M1611 Unilateral primary osteoarthritis, right hip: Secondary | ICD-10-CM | POA: Diagnosis not present

## 2019-01-02 MED ORDER — MELOXICAM 15 MG PO TABS: 15.0000 mg | ORAL_TABLET | Freq: Every day | ORAL | 2 refills | Status: DC

## 2019-01-02 NOTE — Progress Notes (Signed)
Office Visit Note   Patient: Norman Dunn           Date of Birth: 05-16-1961           MRN: [id number] Visit Date: 01/02/2019              Requested by: Leamon Arnt, [place],  [place] [address] PCP: Leamon Arnt, MD   Assessment & Plan: Visit Diagnoses:  1. Unilateral primary osteoarthritis, right hip     Plan: We went over in detail again hip replacement surgery.  We talked about his intraoperative and postoperative course.  We talked about the risk and benefits of surgery.  We went over hip replacement model and showed him implants.  All question concerns were answered and addressed.  He is at the point he does use proceed with surgery and I agree with this given the failure conservative treatment for over a year and given his clinical exam and radiographic findings.  Follow-Up Instructions: Return for 2 weeks post-op.   Orders:  No orders of the defined types were placed in this encounter.  Meds ordered this encounter  Medications  . meloxicam (MOBIC) 15 MG tablet    Sig: Take 1 tablet (15 mg total) by mouth daily.    Dispense:  30 tablet    Refill:  2      Procedures: No procedures performed   Clinical Data: No additional findings.   Subjective: Chief Complaint  Patient presents with  . Right Hip - Pain  The patient is well-known to me.  He has been dealing with significant right hip pain and known osteoarthritis for a long period of time.  In September 2019 we did obtain an MRI of his right hip after plain films showed some femoral acetabular impingement.  The MRI of his right hip showed severe end-stage arthritis with bone-on-bone wear and areas of complete loss of the articular cartilage on the weightbearing surface of his hip.  He is tried conservative treatment for well over a year now.  He is worked on activity modification and strengthening exercises.  He is tried anti-inflammatories.  He would like a prescription for meloxicam  today.  He is at the point to where his hip pain is detrimentally affected his mobility, his quality of life and his actives daily living that he would like to proceed with a hip replacement surgery on the right side.  We have given him a handout for about the surgery and talked about what it involves.  He does feel ready to proceed.  He is had no other acute or active changes in medical status.  He is a healthy 58 year old gentleman who is not a diabetic.  HPI  Review of Systems He currently denies any headache, chest pain, shortness of breath, fever, chills, nausea, vomiting  Objective: Vital Signs: There were no vitals taken for this visit.  Physical Exam He is alert and orient x3 and in no acute distress Ortho Exam Examination of both hips shows more stiffness on the right hip with internal and external rotation with pain in the groin as well.  His leg lengths are equal. Specialty Comments:  No specialty comments available.  Imaging: No results found. Both plain films and MRI from last year show significant and severe osteoarthritis of his right hip with significant cartilage loss and bone-on-bone wear.  There are signs of femoral acetabular impingement.  PMFS History: Patient Active Problem List   Diagnosis Date  Noted  . Adenomatous polyps 05/18/2018  . Femoroacetabular impingement of right hip 02/16/2018  . Unilateral primary osteoarthritis, right hip 02/16/2018  . Genital herpes 05/06/2014  . Gastroesophageal reflux disease without esophagitis 07/12/2013  . OSA (obstructive sleep apnea) 09/07/2012   Past Medical History:  Diagnosis Date  . Adenomatous polyps 05/18/2018  . GERD (gastroesophageal reflux disease)   . High cholesterol   . OSA (obstructive sleep apnea)   . Sleep apnea    cpap every night    Family History  Problem Relation Age of Onset  . Prostate cancer Father   . Leukemia Son 2       cured  . Colon cancer Neg Hx   . Colon polyps Neg Hx   . Rectal  cancer Neg Hx   . Stomach cancer Neg Hx   . Esophageal cancer Neg Hx   . Hypertension Neg Hx   . Diabetes Neg Hx     Past Surgical History:  Procedure Laterality Date  . COLONOSCOPY    . DUPUYTREN CONTRACTURE RELEASE Right 10/19/2016   Procedure: RIGHT SMALL FINGER Z-PLASTY, PROXIMAL INTERPHALANGEAL JOINT RELEASE;  Surgeon: Leanora Cover, MD;  Location: [facility];  Service: Orthopedics;  Laterality: Right;  axillary block in preop  . ESOPHAGEAL DILATION    . INGUINAL HERNIA REPAIR     right side  . PERCUTANEOUS PINNING Right 06/26/2016   Procedure: RIGHT SMALL FINGER IRRIGATION AND DEBRIDEMENT, POSSIBLE PINNING OF RIGHT SMALL FINGER FRACTURE;  Surgeon: Leanora Cover, MD;  Location: [facility];  Service: Orthopedics;  Laterality: Right;  . POLYPECTOMY     Social History   Occupational History  . Occupation: Environmental education officer, travels    Fish farm manager: [employer].  Tobacco Use  . Smoking status: Never Smoker  . Smokeless tobacco: Never Used  Substance and Sexual Activity  . Alcohol use: Yes    Comment: occasionally  . Drug use: No  . Sexual activity: Not on file

## 2019-01-22 ENCOUNTER — Ambulatory Visit: Payer: BC Managed Care – PPO | Admitting: Podiatry

## 2019-01-22 ENCOUNTER — Other Ambulatory Visit: Payer: Self-pay

## 2019-01-22 VITALS — Temp 97.9°F

## 2019-01-22 DIAGNOSIS — B351 Tinea unguium: Secondary | ICD-10-CM | POA: Diagnosis not present

## 2019-01-22 MED ORDER — CICLOPIROX 8 % EX SOLN: Freq: Every day | CUTANEOUS | 2 refills | Status: DC

## 2019-01-22 NOTE — Progress Notes (Signed)
Subjective:   Patient ID: Norman Dunn, male   DOB: 58 y.o.   MRN: [id number]   HPI 58 year old male presents the office today for concerns of his nails becoming somewhat thickened discolored.  He has no pain in the nails he denies any redness or drainage or any swelling.  This been ongoing about 1 year.  He did have some dark discoloration but this did grow out.  No other concerns.   Review of Systems  All other systems reviewed and are negative.  Past Medical History:  Diagnosis Date  . Adenomatous polyps 05/18/2018  . GERD (gastroesophageal reflux disease)   . High cholesterol   . OSA (obstructive sleep apnea)   . Sleep apnea    cpap every night    Past Surgical History:  Procedure Laterality Date  . COLONOSCOPY    . DUPUYTREN CONTRACTURE RELEASE Right 10/19/2016   Procedure: RIGHT SMALL FINGER Z-PLASTY, PROXIMAL INTERPHALANGEAL JOINT RELEASE;  Surgeon: Leanora Cover, MD;  Location: [facility];  Service: Orthopedics;  Laterality: Right;  axillary block in preop  . ESOPHAGEAL DILATION    . INGUINAL HERNIA REPAIR     right side  . PERCUTANEOUS PINNING Right 06/26/2016   Procedure: RIGHT SMALL FINGER IRRIGATION AND DEBRIDEMENT, POSSIBLE PINNING OF RIGHT SMALL FINGER FRACTURE;  Surgeon: Leanora Cover, MD;  Location: [facility];  Service: Orthopedics;  Laterality: Right;  . POLYPECTOMY       Current Outpatient Medications:  .  ibuprofen (ADVIL,MOTRIN) 200 MG tablet, Take 200 mg by mouth every 6 (six) hours as needed for moderate pain. , Disp: , Rfl:  .  meloxicam (MOBIC) 15 MG tablet, Take 1 tablet (15 mg total) by mouth daily., Disp: 30 tablet, Rfl: 2 .  Multiple Vitamin (MULTIVITAMIN WITH MINERALS) TABS tablet, Take 1 tablet by mouth daily as needed (OCCASIONAL)., Disp: , Rfl:  .  omeprazole (PRILOSEC) 20 MG capsule, Take 1 capsule (20 mg total) by mouth daily., Disp: 90 capsule, Rfl: 3 .  ciclopirox (PENLAC) 8 % solution, Apply topically at bedtime. Apply over nail and  surrounding skin. Apply daily over previous coat. After seven (7) days, may remove with alcohol and continue cycle., Disp: 6.6 mL, Rfl: 2  No Known Allergies       Objective:  Physical Exam  General: AAO x3, NAD  Dermatological: The nails are mildly hypertrophic, dystrophic with yellow-brown discoloration but there is no hyperpigmentation.  No pain the nails there is no surrounding redness or drainage or signs of infection.  No open lesions.  Vascular: Dorsalis Pedis artery and Posterior Tibial artery pedal pulses are 2/4 bilateral with immedate capillary fill time. Pedal hair growth present. No varicosities and no lower extremity edema present bilateral. There is no pain with calf compression, swelling, warmth, erythema.   Neruologic: Grossly intact via light touch bilateral.  Musculoskeletal: No gross boney pedal deformities bilateral. No pain, crepitus, or limitation noted with foot and ankle range of motion bilateral. Muscular strength 5/5 in all groups tested bilateral.  Gait: Unassisted, Nonantalgic.      Assessment:   Onychomycosis     Plan:  -Treatment options discussed including all alternatives, risks, and complications -Etiology of symptoms were discussed -Discussed treatment options for nail fungus.  After discussion regards to multiple options elected for Penlac.  Application instructions, side effects discussed.  Return if symptoms worsen or fail to improve.

## 2019-01-22 NOTE — Patient Instructions (Signed)
Ciclopirox nail solution What is this medicine? CICLOPIROX (sye kloe PEER ox) NAIL SOLUTION is an antifungal medicine. It used to treat fungal infections of the nails. This medicine may be used for other purposes; ask your health care provider or pharmacist if you have questions. COMMON BRAND NAME(S): CNL8, Penlac What should I tell my health care provider before I take this medicine? They need to know if you have any of these conditions:  diabetes mellitus  history of seizures  HIV infection  immune system problems or organ transplant  large areas of burned or damaged skin  peripheral vascular disease or poor circulation  taking corticosteroid medication (including steroid inhalers, cream, or lotion)  an unusual or allergic reaction to ciclopirox, isopropyl alcohol, other medicines, foods, dyes, or preservatives  pregnant or trying to get pregnant  breast-feeding How should I use this medicine? This medicine is for external use only. Follow the directions that come with this medicine exactly. Wash and dry your hands before use. Avoid contact with the eyes, mouth or nose. If you do get this medicine in your eyes, rinse out with plenty of cool tap water. Contact your doctor or health care professional if eye irritation occurs. Use at regular intervals. Do not use your medicine more often than directed. Finish the full course prescribed by your doctor or health care professional even if you think you are better. Do not stop using except on your doctor's advice. Talk to your pediatrician regarding the use of this medicine in children. While this medicine may be prescribed for children as young as 12 years for selected conditions, precautions do apply. Overdosage: If you think you have taken too much of this medicine contact a poison control center or emergency room at once. NOTE: This medicine is only for you. Do not share this medicine with others. What if I miss a dose? If you miss a  dose, use it as soon as you can. If it is almost time for your next dose, use only that dose. Do not use double or extra doses. What may interact with this medicine? Interactions are not expected. Do not use any other skin products without telling your doctor or health care professional. This list may not describe all possible interactions. Give your health care provider a list of all the medicines, herbs, non-prescription drugs, or dietary supplements you use. Also tell them if you smoke, drink alcohol, or use illegal drugs. Some items may interact with your medicine. What should I watch for while using this medicine? Tell your doctor or health care professional if your symptoms get worse. Four to six months of treatment may be needed for the nail(s) to improve. Some people may not achieve a complete cure or clearing of the nails by this time. Tell your doctor or health care professional if you develop sores or blisters that do not heal properly. If your nail infection returns after stopping using this product, contact your doctor or health care professional. What side effects may I notice from receiving this medicine? Side effects that you should report to your doctor or health care professional as soon as possible:  allergic reactions like skin rash, itching or hives, swelling of the face, lips, or tongue  severe irritation, redness, burning, blistering, peeling, swelling, oozing Side effects that usually do not require medical attention (report to your doctor or health care professional if they continue or are bothersome):  mild reddening of the skin  nail discoloration  temporary burning or mild   stinging at the site of application This list may not describe all possible side effects. Call your doctor for medical advice about side effects. You may report side effects to FDA at 1-800-FDA-1088. Where should I keep my medicine? Keep out of the reach of children. Store at room temperature  between 15 and 30 degrees C (59 and 86 degrees F). Do not freeze. Protect from light by storing the bottle in the carton after every use. This medicine is flammable. Keep away from heat and flame. Throw away any unused medicine after the expiration date. NOTE: This sheet is a summary. It may not cover all possible information. If you have questions about this medicine, talk to your doctor, pharmacist, or health care provider.  2020 Elsevier/Gold Standard (2007-09-18 16:49:20)  

## 2019-02-26 DIAGNOSIS — L814 Other melanin hyperpigmentation: Secondary | ICD-10-CM | POA: Diagnosis not present

## 2019-02-26 DIAGNOSIS — L821 Other seborrheic keratosis: Secondary | ICD-10-CM | POA: Diagnosis not present

## 2019-02-26 DIAGNOSIS — Z85828 Personal history of other malignant neoplasm of skin: Secondary | ICD-10-CM | POA: Diagnosis not present

## 2019-02-26 DIAGNOSIS — D225 Melanocytic nevi of trunk: Secondary | ICD-10-CM | POA: Diagnosis not present

## 2019-03-25 ENCOUNTER — Other Ambulatory Visit: Payer: Self-pay | Admitting: Orthopaedic Surgery

## 2019-06-08 ENCOUNTER — Other Ambulatory Visit: Payer: Self-pay

## 2019-06-18 ENCOUNTER — Telehealth: Payer: Self-pay | Admitting: Orthopaedic Surgery

## 2019-06-18 ENCOUNTER — Telehealth: Payer: Self-pay

## 2019-06-18 ENCOUNTER — Other Ambulatory Visit: Payer: Self-pay | Admitting: Physician Assistant

## 2019-06-18 NOTE — Telephone Encounter (Signed)
Patient called. He was sent to have blood drawn and want to know if they could also order a blood test. His reason for requesting was not made clear.   Call back number: [phone]

## 2019-06-18 NOTE — Telephone Encounter (Signed)
Patient called with the below message, can you add a blood test to his orders?    Patient called. He was sent to have blood drawn and want to know if they could also order a blood test. His reason for requesting was not made clear.   Call back number: [phone]

## 2019-06-18 NOTE — Telephone Encounter (Signed)
Please advise  I assume he is talking about the pre-labs?

## 2019-06-18 NOTE — Telephone Encounter (Signed)
Thanks for calling him

## 2019-06-19 ENCOUNTER — Other Ambulatory Visit: Payer: Self-pay | Admitting: Physician Assistant

## 2019-06-20 ENCOUNTER — Inpatient Hospital Stay (HOSPITAL_COMMUNITY)
Admission: RE | Admit: 2019-06-20 | Discharge: 2019-06-20 | Disposition: A | Discharge status: Home / Self Care | Payer: BC Managed Care – PPO | Source: Ambulatory Visit

## 2019-06-23 ENCOUNTER — Other Ambulatory Visit (HOSPITAL_COMMUNITY): Payer: BC Managed Care – PPO

## 2019-07-03 ENCOUNTER — Other Ambulatory Visit: Payer: Self-pay | Admitting: Physician Assistant

## 2019-07-05 ENCOUNTER — Other Ambulatory Visit: Payer: Self-pay

## 2019-07-05 MED ORDER — MELOXICAM 15 MG PO TABS: 15.0000 mg | ORAL_TABLET | Freq: Every day | ORAL | 2 refills | Status: DC

## 2019-07-05 NOTE — Patient Instructions (Addendum)
DUE TO COVID-19 ONLY ONE VISITOR IS ALLOWED TO COME WITH YOU AND STAY IN THE WAITING ROOM ONLY DURING PRE OP AND PROCEDURE DAY OF SURGERY. THE 1 VISITOR MAY VISIT WITH YOU AFTER SURGERY IN YOUR PRIVATE ROOM DURING VISITING HOURS ONLY!    YOU NEED TO HAVE A COVID 19 TEST ON___1/12/2021____ @___immediately  after lab work ____, THIS TEST MUST BE DONE BEFORE SURGERY, COME  [place], [place] , [address].  ([place]) ONCE YOUR COVID TEST IS COMPLETED, PLEASE BEGIN THE QUARANTINE INSTRUCTIONS AS OUTLINED IN YOUR HANDOUT.                Norman Dunn   Your procedure is scheduled on: 07/13/2019   Report to [facility] Main  Entrance   Report to admitting at 6:45AM     Call this number if you have problems the morning of surgery [phone]    PLEASE BRING CPAP MASK AND  TUBING ONLY. DEVICE WILL BE PROVIDED!    Remember: NO SOLID FOOD AFTER MIDNIGHT THE NIGHT PRIOR TO SURGERY. YOU MAY DRINK CLEAR LIQUIDS.   STOP CLEAR LIQUIDS AT ____6:15AM_____ AND THEN DRINK THE ENSURE PRE-SURGERY [place]. NOTHING BY MOUTH AFTER THE ENSURE DRINK!    CLEAR LIQUID DIET   Foods Allowed                                                                     Foods Excluded  Coffee and tea, regular and decaf                             liquids that you cannot  Plain Jell-O any favor except red or purple                                           see through such as: Fruit ices (not with fruit pulp)                                     milk, soups, orange juice  Iced Popsicles                                    All solid food Carbonated beverages, regular and diet                                    Cranberry, grape and apple juices Sports drinks like Gatorade Lightly seasoned clear broth or consume(fat free) Sugar, honey syrup  Sample Menu Breakfast                                Lunch  Supper Cranberry juice                    Beef broth                             Chicken broth Jell-O                                     Grape juice                           Apple juice Coffee or tea                        Jell-O                                      Popsicle                                                Coffee or tea                        Coffee or tea  _____________________________________________________________________   BRUSH YOUR TEETH MORNING OF SURGERY AND RINSE YOUR MOUTH OUT, NO CHEWING GUM CANDY OR MINTS.     Take these medicines the morning of surgery with A SIP OF WATER: OMEPRAZOLE, TYLENOL IF NEEDED                                 You may not have any metal on your body including hair pins and              piercings  Do not wear jewelry, make-up, lotions, powders or perfumes, deodorant             Do not wear nail polish on your fingernails.  Do not shave  48 hours prior to surgery.              Men may shave face and neck.   Do not bring valuables to the hospital. [place].  Contacts, dentures or bridgework may not be worn into surgery.  Leave suitcase in the car. After surgery it may be brought to your room.     Patients discharged the day of surgery will not be allowed to drive home. IF YOU ARE HAVING SURGERY AND GOING HOME THE SAME DAY, YOU MUST HAVE AN ADULT TO DRIVE YOU HOME AND  BE WITH YOU FOR 24 HOURS. YOU MAY GO HOME BY TAXI OR UBER OR ORTHERWISE, BUT AN ADULT MUST ACCOMPANY YOU HOME AND STAY WITH YOU FOR 24 HOURS.  Name and phone number of your driver:  Special Instructions: N/A              Please read over the following fact sheets you were given: _____________________________________________________________________             [facility] - Preparing for  Surgery Before surgery, you can play an important role.  Because skin is not sterile, your skin needs to be as free of germs as possible.  You can reduce the number of germs on your skin by  washing with CHG (chlorahexidine gluconate) soap before surgery.  CHG is an antiseptic cleaner which kills germs and bonds with the skin to continue killing germs even after washing. Please DO NOT use if you have an allergy to CHG or antibacterial soaps.  If your skin becomes reddened/irritated stop using the CHG and inform your nurse when you arrive at Short Stay. Do not shave (including legs and underarms) for at least 48 hours prior to the first CHG shower.  You may shave your face/neck. Please follow these instructions carefully:  1.  Shower with CHG Soap the night before surgery and the  morning of Surgery.  2.  If you choose to wash your hair, wash your hair first as usual with your  normal  shampoo.  3.  After you shampoo, rinse your hair and body thoroughly to remove the  shampoo.                           4.  Use CHG as you would any other liquid soap.  You can apply chg directly  to the skin and wash                       Gently with a scrungie or clean washcloth.  5.  Apply the CHG Soap to your body ONLY FROM THE NECK DOWN.   Do not use on face/ open                           Wound or open sores. Avoid contact with eyes, ears mouth and genitals (private parts).                       Wash face,  Genitals (private parts) with your normal soap.             6.  Wash thoroughly, paying special attention to the area where your surgery  will be performed.  7.  Thoroughly rinse your body with warm water from the neck down.  8.  DO NOT shower/wash with your normal soap after using and rinsing off  the CHG Soap.                9.  Pat yourself dry with a clean towel.            10.  Wear clean pajamas.            11.  Place clean sheets on your bed the night of your first shower and do not  sleep with pets. Day of Surgery : Do not apply any lotions/deodorants the morning of surgery.  Please wear clean clothes to the hospital/surgery center.  FAILURE TO FOLLOW THESE INSTRUCTIONS MAY RESULT IN THE  CANCELLATION OF YOUR SURGERY PATIENT SIGNATURE_________________________________  NURSE SIGNATURE__________________________________  ________________________________________________________________________   Norman Dunn  An incentive spirometer is a tool that can help keep your lungs clear and active. This tool measures how well you are filling your lungs with each breath. Taking long deep breaths may help reverse or decrease the chance of developing breathing (pulmonary) problems (especially infection) following:  A long period of time when you are unable  to move or be active. BEFORE THE PROCEDURE   If the spirometer includes an indicator to show your best effort, your nurse or respiratory therapist will set it to a desired goal.  If possible, sit up straight or lean slightly forward. Try not to slouch.  Hold the incentive spirometer in an upright position. INSTRUCTIONS FOR USE  1. Sit on the edge of your bed if possible, or sit up as far as you can in bed or on a chair. 2. Hold the incentive spirometer in an upright position. 3. Breathe out normally. 4. Place the mouthpiece in your mouth and seal your lips tightly around it. 5. Breathe in slowly and as deeply as possible, raising the piston or the ball toward the top of the column. 6. Hold your breath for 3-5 seconds or for as long as possible. Allow the piston or ball to fall to the bottom of the column. 7. Remove the mouthpiece from your mouth and breathe out normally. 8. Rest for a few seconds and repeat Steps 1 through 7 at least 10 times every 1-2 hours when you are awake. Take your time and take a few normal breaths between deep breaths. 9. The spirometer may include an indicator to show your best effort. Use the indicator as a goal to work toward during each repetition. 10. After each set of 10 deep breaths, practice coughing to be sure your lungs are clear. If you have an incision (the cut made at the time of surgery),  support your incision when coughing by placing a pillow or rolled up towels firmly against it. Once you are able to get out of bed, walk around indoors and cough well. You may stop using the incentive spirometer when instructed by your caregiver.  RISKS AND COMPLICATIONS  Take your time so you do not get dizzy or light-headed.  If you are in pain, you may need to take or ask for pain medication before doing incentive spirometry. It is harder to take a deep breath if you are having pain. AFTER USE  Rest and breathe slowly and easily.  It can be helpful to keep track of a log of your progress. Your caregiver can provide you with a simple table to help with this. If you are using the spirometer at home, follow these instructions: [place] IF:   You are having difficultly using the spirometer.  You have trouble using the spirometer as often as instructed.  Your pain medication is not giving enough relief while using the spirometer.  You develop fever of 100.5 F (38.1 C) or higher. SEEK IMMEDIATE MEDICAL CARE IF:   You cough up bloody sputum that had not been present before.  You develop fever of 102 F (38.9 C) or greater.  You develop worsening pain at or near the incision site. MAKE SURE YOU:   Understand these instructions.  Will watch your condition.  Will get help right away if you are not doing well or get worse. Document Released: 10/25/2006 Document Revised: 09/06/2011 Document Reviewed: 12/26/2006 [facility] Patient Information 2014 [place], [place].   ________________________________________________________________________

## 2019-07-10 ENCOUNTER — Encounter (HOSPITAL_COMMUNITY): Payer: Self-pay

## 2019-07-10 ENCOUNTER — Encounter (HOSPITAL_COMMUNITY)
Admission: RE | Admit: 2019-07-10 | Discharge: 2019-07-10 | Disposition: A | Discharge status: Home / Self Care | Payer: BC Managed Care – PPO | Source: Ambulatory Visit | Attending: Orthopaedic Surgery | Admitting: Orthopaedic Surgery

## 2019-07-10 ENCOUNTER — Other Ambulatory Visit (HOSPITAL_COMMUNITY)
Admission: RE | Admit: 2019-07-10 | Discharge: 2019-07-10 | Disposition: A | Discharge status: Home / Self Care | Payer: BC Managed Care – PPO | Source: Ambulatory Visit | Attending: Orthopaedic Surgery | Admitting: Orthopaedic Surgery

## 2019-07-10 ENCOUNTER — Other Ambulatory Visit: Payer: Self-pay

## 2019-07-10 DIAGNOSIS — Z01812 Encounter for preprocedural laboratory examination: Secondary | ICD-10-CM | POA: Diagnosis not present

## 2019-07-10 DIAGNOSIS — G4733 Obstructive sleep apnea (adult) (pediatric): Secondary | ICD-10-CM | POA: Diagnosis not present

## 2019-07-10 DIAGNOSIS — Z20822 Contact with and (suspected) exposure to covid-19: Secondary | ICD-10-CM | POA: Diagnosis not present

## 2019-07-10 LAB — CBC
HCT: 42.2 % (ref 39.0–52.0)
Hemoglobin: 14.4 g/dL (ref 13.0–17.0)
MCH: 31.9 pg (ref 26.0–34.0)
MCHC: 34.1 g/dL (ref 30.0–36.0)
MCV: 93.6 fL (ref 80.0–100.0)
Platelets: 226 10*3/uL (ref 150–400)
RBC: 4.51 MIL/uL (ref 4.22–5.81)
RDW: 11.4 % — ABNORMAL LOW (ref 11.5–15.5)
WBC: 5.1 10*3/uL (ref 4.0–10.5)
nRBC: 0 % (ref 0.0–0.2)

## 2019-07-10 LAB — SURGICAL PCR SCREEN
MRSA, PCR: NEGATIVE
Staphylococcus aureus: POSITIVE — AB

## 2019-07-10 NOTE — Progress Notes (Signed)
PCP - Leamon Arnt, MD; patient states " yes , I guess is still my primary doctor but I only saw her once a couple years ago, I don't go to the doctor much" Cardiologist - n/a  Chest x-ray - n/a EKG - n/a Stress Test - n/a ECHO - n/a Cardiac Cath - n/a  Sleep Study - yes CPAP - yes  Fasting Blood Sugar - n/a Checks Blood Sugar _____ times a day  Blood Thinner Instructions:n/a Aspirin Instructions: Last Dose:  Anesthesia review:  Hx of OSA, uses CPAP nightly   Patient denies shortness of breath, fever, cough and chest pain at PAT appointment   Patient verbalized understanding of instructions that were given to them at the PAT appointment. Patient was also instructed that they will need to review over the PAT instructions again at home before surgery.

## 2019-07-11 LAB — NOVEL CORONAVIRUS, NAA (HOSP ORDER, SEND-OUT TO REF LAB; TAT 18-24 HRS): SARS-CoV-2, NAA: NOT DETECTED

## 2019-07-12 ENCOUNTER — Other Ambulatory Visit: Payer: Self-pay | Admitting: Orthopaedic Surgery

## 2019-07-12 MED ORDER — ONDANSETRON 4 MG PO TBDP: 4.0000 mg | ORAL_TABLET | Freq: Three times a day (TID) | ORAL | 0 refills | Status: DC | PRN

## 2019-07-12 MED ORDER — OXYCODONE HCL 5 MG PO TABS: 5.0000 mg | ORAL_TABLET | ORAL | 0 refills | Status: DC | PRN

## 2019-07-12 MED ORDER — METHOCARBAMOL 500 MG PO TABS: 500.0000 mg | ORAL_TABLET | Freq: Four times a day (QID) | ORAL | 1 refills | Status: DC

## 2019-07-12 MED ORDER — ASPIRIN 81 MG PO CHEW: 81.0000 mg | CHEWABLE_TABLET | Freq: Two times a day (BID) | ORAL | 0 refills | Status: DC

## 2019-07-13 ENCOUNTER — Ambulatory Visit (HOSPITAL_COMMUNITY): Payer: BC Managed Care – PPO | Admitting: Physician Assistant

## 2019-07-13 ENCOUNTER — Ambulatory Visit (HOSPITAL_COMMUNITY): Payer: BC Managed Care – PPO

## 2019-07-13 ENCOUNTER — Ambulatory Visit (HOSPITAL_COMMUNITY)
Admission: RE | Admit: 2019-07-13 | Discharge: 2019-07-13 | Disposition: A | Discharge status: Home / Self Care | Payer: BC Managed Care – PPO | Attending: Orthopaedic Surgery | Admitting: Orthopaedic Surgery

## 2019-07-13 ENCOUNTER — Encounter (HOSPITAL_COMMUNITY)
Admission: RE | Disposition: A | Discharge status: Home / Self Care | Payer: Self-pay | Source: Home / Self Care | Attending: Orthopaedic Surgery

## 2019-07-13 ENCOUNTER — Encounter (HOSPITAL_COMMUNITY): Payer: Self-pay | Admitting: Orthopaedic Surgery

## 2019-07-13 DIAGNOSIS — M25851 Other specified joint disorders, right hip: Secondary | ICD-10-CM | POA: Diagnosis not present

## 2019-07-13 DIAGNOSIS — M1611 Unilateral primary osteoarthritis, right hip: Secondary | ICD-10-CM | POA: Diagnosis present

## 2019-07-13 DIAGNOSIS — Z419 Encounter for procedure for purposes other than remedying health state, unspecified: Secondary | ICD-10-CM

## 2019-07-13 DIAGNOSIS — M25451 Effusion, right hip: Secondary | ICD-10-CM | POA: Insufficient documentation

## 2019-07-13 DIAGNOSIS — M25751 Osteophyte, right hip: Secondary | ICD-10-CM | POA: Diagnosis not present

## 2019-07-13 DIAGNOSIS — G4733 Obstructive sleep apnea (adult) (pediatric): Secondary | ICD-10-CM | POA: Diagnosis not present

## 2019-07-13 DIAGNOSIS — M8568 Other cyst of bone, other site: Secondary | ICD-10-CM | POA: Diagnosis not present

## 2019-07-13 DIAGNOSIS — E7800 Pure hypercholesterolemia, unspecified: Secondary | ICD-10-CM | POA: Diagnosis not present

## 2019-07-13 DIAGNOSIS — M24851 Other specific joint derangements of right hip, not elsewhere classified: Secondary | ICD-10-CM | POA: Diagnosis not present

## 2019-07-13 DIAGNOSIS — K219 Gastro-esophageal reflux disease without esophagitis: Secondary | ICD-10-CM | POA: Insufficient documentation

## 2019-07-13 DIAGNOSIS — Z96641 Presence of right artificial hip joint: Secondary | ICD-10-CM | POA: Diagnosis not present

## 2019-07-13 DIAGNOSIS — Z471 Aftercare following joint replacement surgery: Secondary | ICD-10-CM | POA: Diagnosis not present

## 2019-07-13 HISTORY — PX: TOTAL HIP ARTHROPLASTY: SHX124

## 2019-07-13 SURGERY — ARTHROPLASTY, HIP, TOTAL, ANTERIOR APPROACH
Anesthesia: Spinal | Site: Hip | Laterality: Right

## 2019-07-13 MED ORDER — EPHEDRINE SULFATE-NACL 50-0.9 MG/10ML-% IV SOSY
PREFILLED_SYRINGE | INTRAVENOUS | Status: DC | PRN
  Administered 2019-07-13: 10 mg via INTRAVENOUS
  Administered 2019-07-13 (×3): 5 mg via INTRAVENOUS
  Administered 2019-07-13: 10 mg via INTRAVENOUS
  Administered 2019-07-13: 5 mg via INTRAVENOUS
  Administered 2019-07-13: 10 mg via INTRAVENOUS

## 2019-07-13 MED ORDER — LACTATED RINGERS IV BOLUS
250.0000 mL | Freq: Once | INTRAVENOUS | Status: AC
  Administered 2019-07-13: 11:00:00 250 mL via INTRAVENOUS

## 2019-07-13 MED ORDER — METHOCARBAMOL 500 MG PO TABS
500.0000 mg | ORAL_TABLET | Freq: Four times a day (QID) | ORAL | Status: DC | PRN
Start: 2019-07-13 — End: 2019-07-13

## 2019-07-13 MED ORDER — FENTANYL CITRATE (PF) 100 MCG/2ML IJ SOLN
INTRAMUSCULAR | Status: DC | PRN
  Administered 2019-07-13 (×2): 50 ug via INTRAVENOUS

## 2019-07-13 MED ORDER — 0.9 % SODIUM CHLORIDE (POUR BTL) OPTIME
TOPICAL | Status: DC | PRN
  Administered 2019-07-13: 10:00:00 1000 mL

## 2019-07-13 MED ORDER — ACETAMINOPHEN 500 MG PO TABS
1000.0000 mg | ORAL_TABLET | Freq: Once | ORAL | Status: AC
  Administered 2019-07-13: 08:00:00 1000 mg via ORAL
  Filled 2019-07-13: qty 2

## 2019-07-13 MED ORDER — HYDROMORPHONE HCL 1 MG/ML IJ SOLN
0.2500 mg | INTRAMUSCULAR | Status: DC | PRN
  Administered 2019-07-13: 0.5 mg via INTRAVENOUS

## 2019-07-13 MED ORDER — ACETAMINOPHEN 325 MG PO TABS
ORAL_TABLET | ORAL | Status: AC
  Filled 2019-07-13: qty 2

## 2019-07-13 MED ORDER — ONDANSETRON HCL 4 MG/2ML IJ SOLN
INTRAMUSCULAR | Status: AC
  Filled 2019-07-13: qty 2

## 2019-07-13 MED ORDER — METHOCARBAMOL 500 MG IVPB - SIMPLE MED: 500.0000 mg | Freq: Four times a day (QID) | INTRAVENOUS | Status: DC | PRN

## 2019-07-13 MED ORDER — OXYCODONE HCL 5 MG/5ML PO SOLN: 5.0000 mg | Freq: Once | ORAL | Status: AC | PRN

## 2019-07-13 MED ORDER — HYDROMORPHONE HCL 1 MG/ML IJ SOLN
INTRAMUSCULAR | Status: AC
  Administered 2019-07-13: 0.5 mg via INTRAVENOUS
  Filled 2019-07-13: qty 1

## 2019-07-13 MED ORDER — FENTANYL CITRATE (PF) 100 MCG/2ML IJ SOLN
INTRAMUSCULAR | Status: AC
  Filled 2019-07-13: qty 2

## 2019-07-13 MED ORDER — EPHEDRINE 5 MG/ML INJ
INTRAVENOUS | Status: AC
  Filled 2019-07-13: qty 10

## 2019-07-13 MED ORDER — TRANEXAMIC ACID-NACL 1000-0.7 MG/100ML-% IV SOLN
1000.0000 mg | INTRAVENOUS | Status: AC
  Administered 2019-07-13: 1000 mg via INTRAVENOUS
  Filled 2019-07-13: qty 100

## 2019-07-13 MED ORDER — SODIUM CHLORIDE 0.9 % IR SOLN
Status: DC | PRN
  Administered 2019-07-13: 1000 mL

## 2019-07-13 MED ORDER — METHOCARBAMOL 500 MG IVPB - SIMPLE MED
INTRAVENOUS | Status: AC
  Administered 2019-07-13: 12:00:00 500 mg via INTRAVENOUS
  Filled 2019-07-13: qty 50

## 2019-07-13 MED ORDER — PROMETHAZINE HCL 25 MG/ML IJ SOLN: 6.2500 mg | INTRAMUSCULAR | Status: DC | PRN

## 2019-07-13 MED ORDER — BUPIVACAINE HCL (PF) 0.75 % IJ SOLN
INTRAMUSCULAR | Status: DC | PRN
  Administered 2019-07-13: 1.8 mg via INTRATHECAL

## 2019-07-13 MED ORDER — STERILE WATER FOR IRRIGATION IR SOLN
Status: DC | PRN
  Administered 2019-07-13: 2000 mL

## 2019-07-13 MED ORDER — KETOROLAC TROMETHAMINE 15 MG/ML IJ SOLN
INTRAMUSCULAR | Status: AC
  Administered 2019-07-13: 7.5 mg via INTRAVENOUS
  Filled 2019-07-13: qty 1

## 2019-07-13 MED ORDER — CEFAZOLIN SODIUM-DEXTROSE 2-4 GM/100ML-% IV SOLN
2.0000 g | INTRAVENOUS | Status: AC
  Administered 2019-07-13: 2 g via INTRAVENOUS
  Filled 2019-07-13: qty 100

## 2019-07-13 MED ORDER — ONDANSETRON HCL 4 MG/2ML IJ SOLN
INTRAMUSCULAR | Status: DC | PRN
  Administered 2019-07-13: 4 mg via INTRAVENOUS

## 2019-07-13 MED ORDER — GLYCOPYRROLATE PF 0.2 MG/ML IJ SOSY
PREFILLED_SYRINGE | INTRAMUSCULAR | Status: AC
  Filled 2019-07-13: qty 1

## 2019-07-13 MED ORDER — POVIDONE-IODINE 10 % EX SWAB
2.0000 "application " | Freq: Once | CUTANEOUS | Status: AC
  Administered 2019-07-13: 2 via TOPICAL

## 2019-07-13 MED ORDER — OXYCODONE HCL 5 MG PO TABS: 5.0000 mg | ORAL_TABLET | Freq: Once | ORAL | Status: AC | PRN

## 2019-07-13 MED ORDER — PROPOFOL 500 MG/50ML IV EMUL
INTRAVENOUS | Status: AC
  Filled 2019-07-13: qty 50

## 2019-07-13 MED ORDER — LACTATED RINGERS IV BOLUS
500.0000 mL | Freq: Once | INTRAVENOUS | Status: AC
  Administered 2019-07-13: 500 mL via INTRAVENOUS

## 2019-07-13 MED ORDER — CEFAZOLIN SODIUM-DEXTROSE 2-4 GM/100ML-% IV SOLN
INTRAVENOUS | Status: AC
  Administered 2019-07-13: 2 g via INTRAVENOUS
  Filled 2019-07-13: qty 100

## 2019-07-13 MED ORDER — CHLORHEXIDINE GLUCONATE 4 % EX LIQD: 60.0000 mL | Freq: Once | CUTANEOUS | Status: DC

## 2019-07-13 MED ORDER — PROPOFOL 500 MG/50ML IV EMUL
INTRAVENOUS | Status: DC | PRN
  Administered 2019-07-13: 100 ug/kg/min via INTRAVENOUS

## 2019-07-13 MED ORDER — GLYCOPYRROLATE PF 0.2 MG/ML IJ SOSY
PREFILLED_SYRINGE | INTRAMUSCULAR | Status: DC | PRN
  Administered 2019-07-13: .2 mg via INTRAVENOUS

## 2019-07-13 MED ORDER — MIDAZOLAM HCL 2 MG/2ML IJ SOLN
INTRAMUSCULAR | Status: AC
  Filled 2019-07-13: qty 2

## 2019-07-13 MED ORDER — KETOROLAC TROMETHAMINE 30 MG/ML IJ SOLN: 30.0000 mg | Freq: Once | INTRAMUSCULAR | Status: DC | PRN

## 2019-07-13 MED ORDER — MIDAZOLAM HCL 5 MG/5ML IJ SOLN
INTRAMUSCULAR | Status: DC | PRN
  Administered 2019-07-13: 2 mg via INTRAVENOUS

## 2019-07-13 MED ORDER — KETOROLAC TROMETHAMINE 15 MG/ML IJ SOLN: 7.5000 mg | Freq: Four times a day (QID) | INTRAMUSCULAR | Status: DC

## 2019-07-13 MED ORDER — OXYCODONE HCL 5 MG PO TABS
ORAL_TABLET | ORAL | Status: AC
  Administered 2019-07-13: 5 mg via ORAL
  Filled 2019-07-13: qty 1

## 2019-07-13 MED ORDER — LACTATED RINGERS IV SOLN: INTRAVENOUS | Status: DC

## 2019-07-13 MED ORDER — ACETAMINOPHEN 325 MG PO TABS
325.0000 mg | ORAL_TABLET | Freq: Four times a day (QID) | ORAL | Status: DC | PRN
  Administered 2019-07-13: 650 mg via ORAL

## 2019-07-13 MED ORDER — CEFAZOLIN SODIUM-DEXTROSE 2-4 GM/100ML-% IV SOLN: 2.0000 g | INTRAVENOUS | Status: AC

## 2019-07-13 MED ORDER — CEFAZOLIN SODIUM-DEXTROSE 1-4 GM/50ML-% IV SOLN: 1.0000 g | Freq: Four times a day (QID) | INTRAVENOUS | Status: DC

## 2019-07-13 SURGICAL SUPPLY — 44 items
BAG ZIPLOCK 12X15 (MISCELLANEOUS) IMPLANT
BENZOIN TINCTURE PRP APPL 2/3 (GAUZE/BANDAGES/DRESSINGS) IMPLANT
BLADE SAW SGTL 18X1.27X75 (BLADE) ×2 IMPLANT
BLADE SAW SGTL 18X1.27X75MM (BLADE) ×1
CLOSURE WOUND 1/2 X4 (GAUZE/BANDAGES/DRESSINGS)
COVER PERINEAL POST (MISCELLANEOUS) ×3 IMPLANT
COVER SURGICAL LIGHT HANDLE (MISCELLANEOUS) ×3 IMPLANT
COVER WAND RF STERILE (DRAPES) ×3 IMPLANT
CUP ACET PNNCL SECTR W/GRIP 56 (Hips) IMPLANT
DRAPE STERI IOBAN 125X83 (DRAPES) ×3 IMPLANT
DRAPE U-SHAPE 47X51 STRL (DRAPES) ×6 IMPLANT
DRESSING AQUACEL AG SP 3.5X10 (GAUZE/BANDAGES/DRESSINGS) IMPLANT
DRSG AQUACEL AG ADV 3.5X10 (GAUZE/BANDAGES/DRESSINGS) ×3 IMPLANT
DRSG AQUACEL AG SP 3.5X10 (GAUZE/BANDAGES/DRESSINGS) ×3
DURAPREP 26ML APPLICATOR (WOUND CARE) ×3 IMPLANT
ELECT REM PT RETURN 15FT ADLT (MISCELLANEOUS) ×3 IMPLANT
GAUZE XEROFORM 1X8 LF (GAUZE/BANDAGES/DRESSINGS) ×3 IMPLANT
GLOVE BIO SURGEON STRL SZ7.5 (GLOVE) ×3 IMPLANT
GLOVE BIOGEL PI IND STRL 8 (GLOVE) ×2 IMPLANT
GLOVE BIOGEL PI INDICATOR 8 (GLOVE) ×4
GLOVE ECLIPSE 8.0 STRL XLNG CF (GLOVE) ×3 IMPLANT
GOWN STRL REUS W/TWL XL LVL3 (GOWN DISPOSABLE) ×6 IMPLANT
HANDPIECE INTERPULSE COAX TIP (DISPOSABLE) ×2
HEAD CERAMIC 36 PLUS5 (Hips) ×2 IMPLANT
HOLDER FOLEY CATH W/STRAP (MISCELLANEOUS) ×3 IMPLANT
KIT TURNOVER KIT A (KITS) IMPLANT
LINER NEUTRAL 52MMX36MMX56N (Liner) ×2 IMPLANT
PACK ANTERIOR HIP CUSTOM (KITS) ×3 IMPLANT
PENCIL SMOKE EVACUATOR (MISCELLANEOUS) ×2 IMPLANT
PINN SECTOR W/GRIP ACE CUP 56 (Hips) ×3 IMPLANT
SCREW 6.5MMX25MM (Screw) ×2 IMPLANT
SET HNDPC FAN SPRY TIP SCT (DISPOSABLE) ×1 IMPLANT
STAPLER VISISTAT 35W (STAPLE) ×2 IMPLANT
STEM FEMORAL SZ6 HIGH ACTIS (Stem) ×2 IMPLANT
STRIP CLOSURE SKIN 1/2X4 (GAUZE/BANDAGES/DRESSINGS) IMPLANT
SUT ETHIBOND NAB CT1 #1 30IN (SUTURE) ×3 IMPLANT
SUT ETHILON 2 0 PS N (SUTURE) IMPLANT
SUT MNCRL AB 4-0 PS2 18 (SUTURE) IMPLANT
SUT VIC AB 0 CT1 36 (SUTURE) ×3 IMPLANT
SUT VIC AB 1 CT1 36 (SUTURE) ×3 IMPLANT
SUT VIC AB 2-0 CT1 27 (SUTURE) ×4
SUT VIC AB 2-0 CT1 TAPERPNT 27 (SUTURE) ×2 IMPLANT
TRAY FOLEY MTR SLVR 16FR STAT (SET/KITS/TRAYS/PACK) ×2 IMPLANT
YANKAUER SUCT BULB TIP 10FT TU (MISCELLANEOUS) ×3 IMPLANT

## 2019-07-13 NOTE — Evaluation (Signed)
Physical Therapy Evaluation Patient Details Name: Norman Dunn MRN: [id number] DOB: 05/18/1961 Today's Date: 07/13/2019   History of Present Illness  Patient is 58 y.o. male s/p Rt THA anterior approach with PMH signifcant for OSA, GERD, and OA.  Clinical Impression  Norman Dunn is a 58 y.o. male POD 0 s/p Rt THA anterior approach. Patient reports independence with mobility at baseline. Patient is now limited by functional impairments (see PT problem list below) and requires supervision for transfers and gait with RW. Patient was able to ambulate ~120 feet with RW and sueprvision and cues for safe walker management. Patient educated on safe sequencing for stair mobility and verbalized safe guarding position for people assisting with mobility. Patient instructed in exercises to facilitate ROM and circulation. Patient will benefit from continued skilled PT interventions to address impairments and progress towards PLOF. Patient has met mobility goals at adequate level for discharge home; will continue to follow if pt continues acute stay to progress towards Mod I goals.     Follow Up Recommendations Follow surgeon's recommendation for DC plan and follow-up therapies    Equipment Recommendations  Rolling walker with 5" wheels(delivered in PACU)    Recommendations for Other Services       Precautions / Restrictions Precautions Precautions: Fall Restrictions Weight Bearing Restrictions: No      Mobility  Bed Mobility Overal bed mobility: Needs Assistance Bed Mobility: Supine to Sit     Supine to sit: Supervision;HOB elevated     General bed mobility comments: no assist requried, cues for use of gait belt to mobilize Rt LE  Transfers Overall transfer level: Needs assistance Equipment used: Rolling walker (2 wheeled) Transfers: Sit to/from Stand Sit to Stand: Supervision         General transfer comment: cues for safe hand placement and technique with RW, no assist required  for power up  Ambulation/Gait Ambulation/Gait assistance: Supervision Gait Distance (Feet): 120 Feet Assistive device: Rolling walker (2 wheeled) Gait Pattern/deviations: Step-to pattern;Decreased stride length;Decreased stance time - right;Decreased step length - left;Decreased weight shift to right;Antalgic Gait velocity: decreased   General Gait Details: pt hesitant to WB on Rt LE and heavily reliant on UE support to relieve pressure, no overt LOB, cues for safe step pattern and proximity to RW  Stairs Stairs: Yes Stairs assistance: Supervision Stair Management: One rail Left;With cane;Forwards;Step to pattern Number of Stairs: 6(2x3) General stair comments: cues for safe step pattern "up with good, down with bad" no overt LOB noted, pt verbalized understanding of safe guarding position and assistance from person(s) assisting him with mobility at home  Wheelchair Mobility    Modified Rankin (Stroke Patients Only)       Balance Overall balance assessment: Needs assistance Sitting-balance support: Feet supported Sitting balance-Leahy Scale: Good     Standing balance support: During functional activity;Bilateral upper extremity supported Standing balance-Leahy Scale: Fair                Pertinent Vitals/Pain Pain Assessment: Faces Faces Pain Scale: Hurts little more Pain Location: Rt hip Pain Descriptors / Indicators: Aching;Discomfort;Sore Pain Intervention(s): Monitored during session    Home Living Family/patient expects to be discharged to:: Private residence Living Arrangements: Spouse/significant other Available Help at Discharge: Family;Available 24 hours/day Type of Home: House Home Access: Stairs to enter Entrance Stairs-Rails: Left Entrance Stairs-Number of Steps: 4 Home Layout: Two level Home Equipment: Cane - single point;Crutches;Bedside commode;Shower seat - built in      Prior Function Level of   Independence: Independent                Hand Dominance   Dominant Hand: Right    Extremity/Trunk Assessment   Upper Extremity Assessment Upper Extremity Assessment: Overall WFL for tasks assessed    Lower Extremity Assessment Lower Extremity Assessment: Overall WFL for tasks assessed    Cervical / Trunk Assessment Cervical / Trunk Assessment: Normal  Communication   Communication: No difficulties  Cognition Arousal/Alertness: Awake/alert Behavior During Therapy: WFL for tasks assessed/performed Overall Cognitive Status: Within Functional Limits for tasks assessed         General Comments      Exercises Total Joint Exercises Ankle Circles/Pumps: AROM;Both;Seated;10 reps Quad Sets: AROM;5 reps;Seated;Right Short Arc Quad: AROM;5 reps;Seated;Right Heel Slides: AAROM;5 reps;Seated;Right Hip ABduction/ADduction: AROM;5 reps;Seated;Right Long Arc Quad: AROM;5 reps;Seated;Right Other Exercises Other Exercises: educated on standing exercises: marching, hip extension, hip abdcution, knee flexion (educated to perform at [employer] for safety)   Assessment/Plan    PT Assessment Patient needs continued PT services  PT Problem List Decreased strength;Decreased balance;Decreased mobility;Decreased range of motion;Decreased activity tolerance;Decreased knowledge of use of DME       PT Treatment Interventions DME instruction;Functional mobility training;Balance training;Patient/family education;Gait training;Therapeutic activities;Therapeutic exercise;Stair training    PT Goals (Current goals can be found in the Care Plan section)  Acute Rehab PT Goals Patient Stated Goal: to get home and back to independnence PT Goal Formulation: With patient Time For Goal Achievement: 07/20/19 Potential to Achieve Goals: Good    Frequency 7X/week    AM-PAC PT "6 Clicks" Mobility  Outcome Measure Help needed turning from your back to your side while in a flat bed without using bedrails?: None Help needed moving from lying  on your back to sitting on the side of a flat bed without using bedrails?: A Little Help needed moving to and from a bed to a chair (including a wheelchair)?: A Little Help needed standing up from a chair using your arms (e.g., wheelchair or bedside chair)?: A Little Help needed to walk in hospital room?: A Little Help needed climbing 3-5 steps with a railing? : A Little 6 Click Score: 19    End of Session Equipment Utilized During Treatment: Gait belt Activity Tolerance: Patient tolerated treatment well Patient left: with call bell/phone within reach;in chair Nurse Communication: Mobility status PT Visit Diagnosis: Muscle weakness (generalized) (M62.81);Difficulty in walking, not elsewhere classified (R26.2)    Time: 8563-1497 PT Time Calculation (min) (ACUTE ONLY): 39 min   Charges:   PT Evaluation $PT Eval Low Complexity: 1 Low PT Treatments $Gait Training: 8-22 mins $Therapeutic Exercise: 8-22 mins        Verner Mould, DPT Physical Therapist with [facility] [phone]  07/13/2019 8:18 PM

## 2019-07-13 NOTE — Brief Op Note (Signed)
07/13/2019  10:30 AM  PATIENT:  Otto Herb  59 y.o. male  PRE-OPERATIVE DIAGNOSIS:  osteoarthritis right hip  POST-OPERATIVE DIAGNOSIS:  osteoarthritis right hip  PROCEDURE:  Procedure(s): RIGHT TOTAL HIP ARTHROPLASTY ANTERIOR APPROACH (Right)  SURGEON:  Surgeon(s) and Role:    Mcarthur Rossetti, MD - Primary  PHYSICIAN ASSISTANT: Benita Stabile, PA-C  ANESTHESIA:   spinal  EBL:  250 mL   COUNTS:  YES  DICTATION: .Other Dictation: Dictation Number [id number]  PLAN OF CARE: Discharge to home after PACU  PATIENT DISPOSITION:  PACU - hemodynamically stable.   Delay start of Pharmacological VTE agent (>24hrs) due to surgical blood loss or risk of bleeding: no

## 2019-07-13 NOTE — Discharge Instructions (Signed)

## 2019-07-13 NOTE — H&P (Signed)
TOTAL HIP ADMISSION H&P  Patient is admitted for right total hip arthroplasty.  Subjective:  Chief Complaint: right hip pain  HPI: Norman Dunn, 59 y.o. male, has a history of pain and functional disability in the right hip(s) due to arthritis and patient has failed non-surgical conservative treatments for greater than 12 weeks to include NSAID's and/or analgesics, corticosteriod injections and activity modification.  Onset of symptoms was gradual starting 5 years ago with gradually worsening course since that time.The patient noted no past surgery on the right hip(s).  Patient currently rates pain in the right hip at 10 out of 10 with activity. Patient has night pain, worsening of pain with activity and weight bearing, pain that interfers with activities of daily living, pain with passive range of motion and crepitus. Patient has evidence of subchondral cysts, subchondral sclerosis, periarticular osteophytes and joint space narrowing by imaging studies. This condition presents safety issues increasing the risk of falls.  There is no current active infection.  Patient Active Problem List   Diagnosis Date Noted  . Adenomatous polyps 05/18/2018  . Femoroacetabular impingement of right hip 02/16/2018  . Unilateral primary osteoarthritis, right hip 02/16/2018  . Genital herpes 05/06/2014  . Gastroesophageal reflux disease without esophagitis 07/12/2013  . OSA (obstructive sleep apnea) 09/07/2012   Past Medical History:  Diagnosis Date  . Adenomatous polyps 05/18/2018  . GERD (gastroesophageal reflux disease)   . High cholesterol   . OSA (obstructive sleep apnea)   . Sleep apnea    cpap every night    Past Surgical History:  Procedure Laterality Date  . COLONOSCOPY    . DUPUYTREN CONTRACTURE RELEASE Right 10/19/2016   Procedure: RIGHT SMALL FINGER Z-PLASTY, PROXIMAL INTERPHALANGEAL JOINT RELEASE;  Surgeon: Leanora Cover, MD;  Location: [facility];  Service: Orthopedics;   Laterality: Right;  axillary block in preop  . ESOPHAGEAL DILATION    . INGUINAL HERNIA REPAIR     right side  . PERCUTANEOUS PINNING Right 06/26/2016   Procedure: RIGHT SMALL FINGER IRRIGATION AND DEBRIDEMENT, POSSIBLE PINNING OF RIGHT SMALL FINGER FRACTURE;  Surgeon: Leanora Cover, MD;  Location: [facility];  Service: Orthopedics;  Laterality: Right;  . POLYPECTOMY      Current Facility-Administered Medications  Medication Dose Route Frequency Provider Last Rate Last Admin  . ceFAZolin (ANCEF) IVPB 2g/100 mL premix  2 g Intravenous On Call to OR Pete Pelt, PA-C      . chlorhexidine (HIBICLENS) 4 % liquid 4 application  60 mL Topical Once Erskine Emery W, PA-C      . lactated ringers infusion   Intravenous Continuous Ellender, Karyl Kinnier, MD      . povidone-iodine 10 % swab 2 application  2 application Topical Once Pete Pelt, PA-C      . tranexamic acid (CYKLOKAPRON) IVPB 1,000 mg  1,000 mg Intravenous To OR Pete Pelt, PA-C       No Known Allergies  Social History   Tobacco Use  . Smoking status: Never Smoker  . Smokeless tobacco: Never Used  Substance Use Topics  . Alcohol use: Yes    Comment: occasionally    Family History  Problem Relation Age of Onset  . Prostate cancer Father   . Leukemia Son 2       cured  . Colon cancer Neg Hx   . Colon polyps Neg Hx   . Rectal cancer Neg Hx   . Stomach cancer Neg Hx   . Esophageal cancer Neg  Hx   . Hypertension Neg Hx   . Diabetes Neg Hx      Review of Systems  All other systems reviewed and are negative.   Objective:  Physical Exam  Constitutional: He is oriented to person, place, and time. He appears well-developed and well-nourished.  HENT:  Head: Normocephalic and atraumatic.  Eyes: Pupils are equal, round, and reactive to light. EOM are normal.  Cardiovascular: Normal rate and regular rhythm.  Respiratory: Effort normal and breath sounds normal.  GI: Soft. Bowel sounds are normal.  Musculoskeletal:      Cervical back: Normal range of motion and neck supple.     Right hip: Tenderness and bony tenderness present. Decreased range of motion. Decreased strength.  Neurological: He is alert and oriented to person, place, and time.  Skin: Skin is warm and dry.  Psychiatric: He has a normal mood and affect.    Vital signs in last 24 hours: Temp:  [97.6 F (36.4 C)] 97.6 F (36.4 C) (01/15 0650) Pulse Rate:  [59] 59 (01/15 0650) Resp:  [14] 14 (01/15 0650) BP: (125)/(76) 125/76 (01/15 0650) SpO2:  [99 %] 99 % (01/15 0650) Weight:  [93.9 kg] 93.9 kg (01/15 0650)  Labs:   Estimated body mass index is 28.87 kg/m as calculated from the following:   Height as of 07/10/19: 5\' 11"  (1.803 m).   Weight as of this encounter: 93.9 kg.   Imaging Review Plain radiographs demonstrate severe degenerative joint disease of the right hip(s). The bone quality appears to be excellent for age and reported activity level.      Assessment/Plan:  End stage arthritis, right hip(s)  The patient history, physical examination, clinical judgement of the provider and imaging studies are consistent with end stage degenerative joint disease of the right hip(s) and total hip arthroplasty is deemed medically necessary. The treatment options including medical management, injection therapy, arthroscopy and arthroplasty were discussed at length. The risks and benefits of total hip arthroplasty were presented and reviewed. The risks due to aseptic loosening, infection, stiffness, dislocation/subluxation,  thromboembolic complications and other imponderables were discussed.  The patient acknowledged the explanation, agreed to proceed with the plan and consent was signed. Patient is being admitted for inpatient treatment for surgery, pain control, PT, OT, prophylactic antibiotics, VTE prophylaxis, progressive ambulation and ADL's and discharge planning.The patient is planning to be discharged home with home health  services    Patient's anticipated LOS is less than 2 midnights, meeting these requirements: - Younger than 13 - Lives within 1 hour of care - Has a competent adult at home to recover with post-op recover - NO history of  - Chronic pain requiring opiods  - Diabetes  - Coronary Artery Disease  - Heart failure  - Heart attack  - Stroke  - DVT/VTE  - Cardiac arrhythmia  - Respiratory Failure/COPD  - Renal failure  - Anemia  - Advanced Liver disease

## 2019-07-13 NOTE — Anesthesia Procedure Notes (Signed)
Procedure Name: MAC Date/Time: 07/13/2019 9:05 AM Performed by: Maxwell Caul, CRNA Pre-anesthesia Checklist: Patient identified, Emergency Drugs available, Suction available and Patient being monitored Oxygen Delivery Method: Simple face mask

## 2019-07-13 NOTE — Care Plan (Signed)
RNCM spoke with patient prior to surgery. CM was asked to assist with ensuring all DME/HH orders were set up due to patient being a same day discharge on date of surgery, 07/13/19. Patient is undergoing a Right THA with Dr. Ninfa Linden today. He has adequate support at home and requested a FWW for home use. An order was sent to [facility] and they are aware and will deliver prior to discharge home today. Also, HHPT is anticipated. Choice provided and referral made to [facility]. Patient is agreeable to all. F/U scheduled with Dr. Ninfa Linden on 07/26/19 at 1:30 pm.

## 2019-07-13 NOTE — Anesthesia Postprocedure Evaluation (Signed)
Anesthesia Post Note  Patient: Norman Dunn  Procedure(s) Performed: RIGHT TOTAL HIP ARTHROPLASTY ANTERIOR APPROACH (Right Hip)     Patient location during evaluation: PACU Anesthesia Type: Spinal Level of consciousness: oriented and awake and alert Pain management: pain level controlled Vital Signs Assessment: post-procedure vital signs reviewed and stable Respiratory status: spontaneous breathing, respiratory function stable and patient connected to nasal cannula oxygen Cardiovascular status: blood pressure returned to baseline and stable Postop Assessment: no headache, no backache, no apparent nausea or vomiting and spinal receding Anesthetic complications: no    Last Vitals:  Vitals:   07/13/19 1300 07/13/19 1400  BP: 108/61 111/74  Pulse: (!) 59 65  Resp:    Temp:    SpO2: 100% 100%    Last Pain:  Vitals:   07/13/19 1315  TempSrc:   PainSc: 0-No pain                 Sarika Baldini P Kaylanni Ezelle

## 2019-07-13 NOTE — Anesthesia Preprocedure Evaluation (Addendum)
Anesthesia Evaluation  Patient identified by MRN, date of birth, ID band Patient awake    Reviewed: Allergy & Precautions, NPO status , Patient's Chart, lab work & pertinent test results  Airway Mallampati: II  TM Distance: >3 FB Neck ROM: Full    Dental no notable dental hx.    Pulmonary sleep apnea and Continuous Positive Airway Pressure Ventilation ,    Pulmonary exam normal breath sounds clear to auscultation       Cardiovascular negative cardio ROS Normal cardiovascular exam Rhythm:Regular Rate:Normal     Neuro/Psych negative neurological ROS  negative psych ROS   GI/Hepatic Neg liver ROS, GERD  Medicated and Controlled,  Endo/Other  negative endocrine ROS  Renal/GU negative Renal ROS     Musculoskeletal negative musculoskeletal ROS (+)   Abdominal   Peds  Hematology HLD   Anesthesia Other Findings osteoarthritis right hip  Reproductive/Obstetrics                            Anesthesia Physical Anesthesia Plan  ASA: II  Anesthesia Plan: Spinal   Post-op Pain Management:    Induction: Intravenous  PONV Risk Score and Plan: 1 and Ondansetron, Dexamethasone, Propofol infusion, Midazolam and Treatment may vary due to age or medical condition  Airway Management Planned: Natural Airway  Additional Equipment:   Intra-op Plan:   Post-operative Plan:   Informed Consent: I have reviewed the patients History and Physical, chart, labs and discussed the procedure including the risks, benefits and alternatives for the proposed anesthesia with the patient or authorized representative who has indicated his/her understanding and acceptance.     Dental advisory given  Plan Discussed with: CRNA  Anesthesia Plan Comments:         Anesthesia Quick Evaluation

## 2019-07-13 NOTE — Transfer of Care (Signed)
Immediate Anesthesia Transfer of Care Note  Patient: Norman Dunn  Procedure(s) Performed: RIGHT TOTAL HIP ARTHROPLASTY ANTERIOR APPROACH (Right Hip)  Patient Location: PACU  Anesthesia Type:MAC and Spinal  Level of Consciousness: awake, alert , oriented and patient cooperative  Airway & Oxygen Therapy: Patient Spontanous Breathing and Patient connected to face mask oxygen  Post-op Assessment: Report given to RN and Post -op Vital signs reviewed and stable  Post vital signs: Reviewed and stable  Last Vitals:  Vitals Value Taken Time  BP 112/69 07/13/19 1046  Temp    Pulse 80 07/13/19 1048  Resp 14 07/13/19 1048  SpO2 98 % 07/13/19 1048  Vitals shown include unvalidated device data.  Last Pain:  Vitals:   07/13/19 0721  TempSrc:   PainSc: 0-No pain         Complications: No apparent anesthesia complications

## 2019-07-13 NOTE — Anesthesia Procedure Notes (Signed)
Spinal  Patient location during procedure: OR End time: 07/13/2019 9:10 AM Staffing Performed: resident/CRNA  Anesthesiologist: Murvin Natal, MD Resident/CRNA: Maxwell Caul, CRNA Preanesthetic Checklist Completed: patient identified, IV checked, site marked, risks and benefits discussed, surgical consent, monitors and equipment checked, pre-op evaluation and timeout performed Spinal Block Patient position: sitting Prep: DuraPrep Patient monitoring: heart rate, cardiac monitor, continuous pulse ox and blood pressure Approach: midline Location: L3-4 Injection technique: single-shot Needle Needle type: Pencan  Needle gauge: 24 G Needle length: 10 cm Assessment Sensory level: T4 Additional Notes IV functioning, full monitors applied to pt. Expiration date of kit checked and confirmed to be in date. Sterile prep and drape, hand hygiene and sterile gloved used. Pt was positioned and spine was prepped in sterile fashion. Skin was anesthetized with lidocaine. Free flow of clear CSF obtained prior to injecting local anesthetic into CSF x 1 attempt. Spinal needle aspirated freely following injection. Needle was carefully withdrawn, and pt tolerated procedure well. Loss of motor and sensory on exam post injection.

## 2019-07-13 NOTE — Op Note (Signed)
NAME: Norman Dunn, ETHRIDGE MEDICAL RECORD [id number] ACCOUNT [id number] DATE OF BIRTH:May 23, 1961 FACILITY: [facility] LOCATION: [facility] PHYSICIAN:Richard Ritchey Kerry Fort, MD  OPERATIVE REPORT  DATE OF PROCEDURE:  07/13/2019  PREOPERATIVE DIAGNOSIS:  Primary osteoarthritis and degenerative joint disease, right hip.  POSTOPERATIVE DIAGNOSIS:  Primary osteoarthritis and degenerative joint disease, right hip.  PROCEDURE:  Right total hip arthroplasty through direct anterior approach.  IMPLANTS:  DePuy Sector Gription acetabular component size 56, with a single 25 mm screw, size 36+0 neutral polyethylene liner, size 6 Actis femoral component with high offset, size 36+5 ceramic hip ball.  SURGEON:  Lind Guest. Ninfa Linden, MD  ASSISTANT:  Erskine Emery, PA-C  ANESTHESIA:  Spinal.  ANTIBIOTICS:  Two grams IV Ancef.  ESTIMATED BLOOD LOSS:  200-250 mL.  COMPLICATIONS:  None.  INDICATIONS:  The patient is a 59 year old gentleman with debilitating arthritis involving his right hip.  This started as femoral acetabular impingement.  He has been having hip pain for multiple years now.  We have seen him for a few years as well.  He  has tried and failed all forms of conservative treatment including activity modification, anti-inflammatories and steroid injections.  At this point, his right hip pain is daily and it is detrimentally affecting his mobility, his quality of life, and  his activities of daily living.  His x-ray showed quite significant worsening of his hip arthritis.  At this point, he does wish to proceed with a total hip arthroplasty.  We talked about the risk of acute blood loss anemia, nerve or vessel injury,  fracture, infection, dislocation, DVT, and implant failure.  We talked about our goals being decreased pain, improve mobility, and overall improve quality of life.  DESCRIPTION OF PROCEDURE:  After informed consent was obtained and appropriate right hip was marked he was brought to  the operating room and sat up on a stretcher where spinal anesthesia was obtained.  He was then placed in the supine position.  The  spinal setup very quick so we did have a vasovagal episode of bradycardia, but anesthesia was able to control that quickly.  They felt it was fine for [place] to proceed with the case.  He had no other complications during the case and again that is not a  true complication, just a vasovagal effect from the quick setup spinal.  We then had a Foley catheter placed and traction boots were placed on both his feet.  Next, he was placed supine on the Hana fracture table with the perineal post in place and both  legs in line skeletal traction device and no traction applied.  His right operative hip was prepped and draped with DuraPrep and sterile drapes.  A time-out was called.  He was identified as correct patient, correct right hip.  We then made an incision  just inferior and posterior to the anterior superior iliac spine and carried this obliquely down the leg.  We dissected down tensor fascia lata muscle.  Tensor fascia was then divided longitudinally to proceed with direct anterior approach to the hip.   We identified and cauterized circumflex vessels and identified the hip capsule.  We opened up the hip capsule in an L-type format, finding moderate joint effusion and significant evidence of femoral acetabular impingement with large periarticular  osteophytes off of the lateral femoral head and neck as well as acetabulum.  We then placed bent Hohmann around the medial and lateral femoral neck and made our femoral neck cut barely above the lesser trochanter because of  his short femoral neck, which  he has bilaterally.  We then finished our cut with an osteotome and placed a corkscrew guide in the femoral head and removed the femoral heads in its entirety and found a wide area completely devoid of cartilage.  We then placed a bent Hohmann over the  medial acetabular rim and removed  remnants of the acetabular labrum and other debris from the socket.  We then began reaming under direct visualization and direct fluoroscopy from a size 44 reamer in stepwise increments up to a size 55.  With all reamers  placed under direct visualization.  The last reamer was also placed under direct fluoroscopy, so we could obtain our depth of reaming, our inclination and anteversion.  I then placed the real DePuy Sector Gription acetabular component size 56 and a  single screw.  We went with a 36+0 neutral polyethylene liner based off his anatomy.  Attention was then turned to the femur.  With the leg externally rotated to 120 degrees, extended and adducted, we were able to place a Mueller retractor medially and  Hohman retractor behind the greater trochanter.  We released lateral joint capsule and used a box-cutting osteotome to enter the femoral canal and a rongeur to lateralize.  We then began broaching using the Actis broaching system from a size zero up to a  size 6.  The size 6 seemed to fill the canal, so we trialed a high offset femoral neck and a 36+1.5 hip ball.  We reduced this in the acetabulum.  We assessed the stability mechanically and radiographically.  We felt like we needed just a little bit  more leg length than offset.  We dislocated the hip and then removed all trial components.  We placed the real Actis high offset femoral component, size 6 and went with a 36+5 ceramic hip ball.  We reduced this in the acetabulum.  We were pleased with  the leg length, range of motion, offset and stability, assessed again mechanically and radiographically.  We then irrigated the soft tissue with normal saline solution using pulsatile lavage.  We were able to reapproximate the joint capsule using #1  Ethibond suture.  We closed tensor fascia with #1 Vicryl followed by closing the deep tissue was 0 Vicryl, 2-0 Vicryl was used to close subcutaneous tissue, and interrupted staples were placed on the skin.   Xeroform and Aquacel dressing was applied.  The  patient was taken to recovery room in stable condition.  All final counts were correct.  There were no complications noted.  Of note, Benita Stabile, PA-C, assisted the entire case.  His assistance was crucial for facilitating all aspects of this case.  CN/NUANCE  D:07/13/2019 T:07/13/2019 JOB:[id number]

## 2019-07-13 NOTE — Progress Notes (Signed)
Pt discharged in NAD, VSS, pain tolerable. Pt voided and tolerating fluids well. Pt given discharge instructions. Discharge instructions reviewed with pt and wife. All questions answered. Pt discharged by wheelchair with walker.

## 2019-07-14 DIAGNOSIS — Z7982 Long term (current) use of aspirin: Secondary | ICD-10-CM | POA: Diagnosis not present

## 2019-07-14 DIAGNOSIS — I509 Heart failure, unspecified: Secondary | ICD-10-CM | POA: Diagnosis not present

## 2019-07-14 DIAGNOSIS — E119 Type 2 diabetes mellitus without complications: Secondary | ICD-10-CM | POA: Diagnosis not present

## 2019-07-14 DIAGNOSIS — Z9181 History of falling: Secondary | ICD-10-CM | POA: Diagnosis not present

## 2019-07-14 DIAGNOSIS — D649 Anemia, unspecified: Secondary | ICD-10-CM | POA: Diagnosis not present

## 2019-07-14 DIAGNOSIS — D369 Benign neoplasm, unspecified site: Secondary | ICD-10-CM | POA: Diagnosis not present

## 2019-07-14 DIAGNOSIS — J449 Chronic obstructive pulmonary disease, unspecified: Secondary | ICD-10-CM | POA: Diagnosis not present

## 2019-07-14 DIAGNOSIS — Z96641 Presence of right artificial hip joint: Secondary | ICD-10-CM | POA: Diagnosis not present

## 2019-07-14 DIAGNOSIS — K219 Gastro-esophageal reflux disease without esophagitis: Secondary | ICD-10-CM | POA: Diagnosis not present

## 2019-07-14 DIAGNOSIS — Z471 Aftercare following joint replacement surgery: Secondary | ICD-10-CM | POA: Diagnosis not present

## 2019-07-14 DIAGNOSIS — G4733 Obstructive sleep apnea (adult) (pediatric): Secondary | ICD-10-CM | POA: Diagnosis not present

## 2019-07-14 DIAGNOSIS — I2510 Atherosclerotic heart disease of native coronary artery without angina pectoris: Secondary | ICD-10-CM | POA: Diagnosis not present

## 2019-07-16 ENCOUNTER — Encounter: Payer: Self-pay | Admitting: *Deleted

## 2019-07-16 ENCOUNTER — Other Ambulatory Visit: Payer: Self-pay | Admitting: Podiatry

## 2019-07-16 ENCOUNTER — Telehealth: Payer: Self-pay | Admitting: Orthopaedic Surgery

## 2019-07-16 NOTE — Telephone Encounter (Signed)
Verbal order given  

## 2019-07-16 NOTE — Telephone Encounter (Signed)
Maria from [place] at [facility] called.   She is requesting verbal orders 1xwk 1 wk, 3xwk 1wk, 2xwk 1wk   Call back number: [phone]

## 2019-07-17 DIAGNOSIS — J449 Chronic obstructive pulmonary disease, unspecified: Secondary | ICD-10-CM | POA: Diagnosis not present

## 2019-07-17 DIAGNOSIS — D369 Benign neoplasm, unspecified site: Secondary | ICD-10-CM | POA: Diagnosis not present

## 2019-07-17 DIAGNOSIS — E119 Type 2 diabetes mellitus without complications: Secondary | ICD-10-CM | POA: Diagnosis not present

## 2019-07-17 DIAGNOSIS — Z7982 Long term (current) use of aspirin: Secondary | ICD-10-CM | POA: Diagnosis not present

## 2019-07-17 DIAGNOSIS — I509 Heart failure, unspecified: Secondary | ICD-10-CM | POA: Diagnosis not present

## 2019-07-17 DIAGNOSIS — I2510 Atherosclerotic heart disease of native coronary artery without angina pectoris: Secondary | ICD-10-CM | POA: Diagnosis not present

## 2019-07-17 DIAGNOSIS — Z9181 History of falling: Secondary | ICD-10-CM | POA: Diagnosis not present

## 2019-07-17 DIAGNOSIS — Z471 Aftercare following joint replacement surgery: Secondary | ICD-10-CM | POA: Diagnosis not present

## 2019-07-17 DIAGNOSIS — D649 Anemia, unspecified: Secondary | ICD-10-CM | POA: Diagnosis not present

## 2019-07-17 DIAGNOSIS — G4733 Obstructive sleep apnea (adult) (pediatric): Secondary | ICD-10-CM | POA: Diagnosis not present

## 2019-07-17 DIAGNOSIS — K219 Gastro-esophageal reflux disease without esophagitis: Secondary | ICD-10-CM | POA: Diagnosis not present

## 2019-07-17 DIAGNOSIS — Z96641 Presence of right artificial hip joint: Secondary | ICD-10-CM | POA: Diagnosis not present

## 2019-07-18 ENCOUNTER — Telehealth: Payer: Self-pay

## 2019-07-18 DIAGNOSIS — J449 Chronic obstructive pulmonary disease, unspecified: Secondary | ICD-10-CM | POA: Diagnosis not present

## 2019-07-18 DIAGNOSIS — K219 Gastro-esophageal reflux disease without esophagitis: Secondary | ICD-10-CM | POA: Diagnosis not present

## 2019-07-18 DIAGNOSIS — G4733 Obstructive sleep apnea (adult) (pediatric): Secondary | ICD-10-CM | POA: Diagnosis not present

## 2019-07-18 DIAGNOSIS — D649 Anemia, unspecified: Secondary | ICD-10-CM | POA: Diagnosis not present

## 2019-07-18 DIAGNOSIS — I2510 Atherosclerotic heart disease of native coronary artery without angina pectoris: Secondary | ICD-10-CM | POA: Diagnosis not present

## 2019-07-18 DIAGNOSIS — Z96641 Presence of right artificial hip joint: Secondary | ICD-10-CM | POA: Diagnosis not present

## 2019-07-18 DIAGNOSIS — Z471 Aftercare following joint replacement surgery: Secondary | ICD-10-CM | POA: Diagnosis not present

## 2019-07-18 DIAGNOSIS — Z9181 History of falling: Secondary | ICD-10-CM | POA: Diagnosis not present

## 2019-07-18 DIAGNOSIS — E119 Type 2 diabetes mellitus without complications: Secondary | ICD-10-CM | POA: Diagnosis not present

## 2019-07-18 DIAGNOSIS — I509 Heart failure, unspecified: Secondary | ICD-10-CM | POA: Diagnosis not present

## 2019-07-18 DIAGNOSIS — D369 Benign neoplasm, unspecified site: Secondary | ICD-10-CM | POA: Diagnosis not present

## 2019-07-18 DIAGNOSIS — Z7982 Long term (current) use of aspirin: Secondary | ICD-10-CM | POA: Diagnosis not present

## 2019-07-18 NOTE — Telephone Encounter (Signed)
Patient's wife Anderson Malta called stating that patient has been itching and has a rash.  Stated that she thinks it may be the Robaxin, which was last taken last night at dinner time.  Stated that it's not the Oxycodone because he hasn't had that since Sunday night.  Patient did take Benadryl last night and also taking Tylenol and baby Aspirin.  Had right total hip on 07/13/2019.  CB# is [phone].  Please advise.  Thank you.

## 2019-07-18 NOTE — Telephone Encounter (Signed)
Patient aware of the below  

## 2019-07-18 NOTE — Telephone Encounter (Signed)
I would just have him stop the Robaxin then. He can always put hydrocortisone cream over any rash. It is likely medication related. It can also be related to the antiseptic that is put around his leg and the incision. However for the rashes other areas it is definitely related to medications. If he wants a different muscle relaxant he should just let us know.

## 2019-07-18 NOTE — Telephone Encounter (Signed)
Please advise 

## 2019-07-20 ENCOUNTER — Other Ambulatory Visit: Payer: Self-pay

## 2019-07-20 ENCOUNTER — Other Ambulatory Visit: Payer: Self-pay | Admitting: Orthopaedic Surgery

## 2019-07-20 ENCOUNTER — Telehealth: Payer: Self-pay

## 2019-07-20 ENCOUNTER — Telehealth: Payer: Self-pay | Admitting: Orthopaedic Surgery

## 2019-07-20 DIAGNOSIS — D369 Benign neoplasm, unspecified site: Secondary | ICD-10-CM | POA: Diagnosis not present

## 2019-07-20 DIAGNOSIS — E119 Type 2 diabetes mellitus without complications: Secondary | ICD-10-CM | POA: Diagnosis not present

## 2019-07-20 DIAGNOSIS — Z9181 History of falling: Secondary | ICD-10-CM | POA: Diagnosis not present

## 2019-07-20 DIAGNOSIS — I509 Heart failure, unspecified: Secondary | ICD-10-CM | POA: Diagnosis not present

## 2019-07-20 DIAGNOSIS — Z96641 Presence of right artificial hip joint: Secondary | ICD-10-CM | POA: Diagnosis not present

## 2019-07-20 DIAGNOSIS — Z7982 Long term (current) use of aspirin: Secondary | ICD-10-CM | POA: Diagnosis not present

## 2019-07-20 DIAGNOSIS — D649 Anemia, unspecified: Secondary | ICD-10-CM | POA: Diagnosis not present

## 2019-07-20 DIAGNOSIS — I2510 Atherosclerotic heart disease of native coronary artery without angina pectoris: Secondary | ICD-10-CM | POA: Diagnosis not present

## 2019-07-20 DIAGNOSIS — Z471 Aftercare following joint replacement surgery: Secondary | ICD-10-CM | POA: Diagnosis not present

## 2019-07-20 DIAGNOSIS — K219 Gastro-esophageal reflux disease without esophagitis: Secondary | ICD-10-CM | POA: Diagnosis not present

## 2019-07-20 DIAGNOSIS — G4733 Obstructive sleep apnea (adult) (pediatric): Secondary | ICD-10-CM | POA: Diagnosis not present

## 2019-07-20 DIAGNOSIS — J449 Chronic obstructive pulmonary disease, unspecified: Secondary | ICD-10-CM | POA: Diagnosis not present

## 2019-07-20 MED ORDER — METHYLPREDNISOLONE 4 MG PO TABS: ORAL_TABLET | ORAL | 0 refills | Status: DC

## 2019-07-20 NOTE — Telephone Encounter (Signed)
Patient hasn't taken any medications other than benadryl and hydrocortisone cream since Monday

## 2019-07-20 NOTE — Telephone Encounter (Signed)
Continue

## 2019-07-20 NOTE — Telephone Encounter (Signed)
Usually any type of rash it is likely still related to medications.  See if he would like [place] to send in a different medicines other than the ones he has been on and have him stop even the muscle relaxant.  I am fine sending in something else would you like to.  As far as the rash goes, the only thing I can recommend is Benadryl and hydrocortisone cream.  He may even want to check with his primary care physician to see if they have any recommendations.

## 2019-07-20 NOTE — Telephone Encounter (Signed)
Wife aware of the medrol dose pak and she agrees to try this and will call her PCP of this doesn't help the rash

## 2019-07-20 NOTE — Telephone Encounter (Signed)
See below

## 2019-07-20 NOTE — Telephone Encounter (Signed)
Patient's wife Anderson Malta called and stated husband still suffering from rash. She is requesting a call back from Dr. Trevor Mace nurse. Patient name is [phone].

## 2019-07-20 NOTE — Telephone Encounter (Signed)
Norman Dunn called concerning message from this morning.  Advised her of previous message in patient's chart for today, 07/20/2019 per Dr. Ninfa Linden.  Voiced that she understands.

## 2019-07-20 NOTE — Telephone Encounter (Signed)
Unfortunately, I am not sure what else to recommend.  I will send in a medrol dose pack to start.

## 2019-07-23 ENCOUNTER — Other Ambulatory Visit: Payer: Self-pay

## 2019-07-23 MED ORDER — OMEPRAZOLE 20 MG PO CPDR: 20.0000 mg | DELAYED_RELEASE_CAPSULE | Freq: Every day | ORAL | 0 refills | Status: DC

## 2019-07-23 NOTE — Telephone Encounter (Signed)
Please call to schedule cpe. No further refills w/o cpe/OV. Last seen in 04/2018. Thanks.

## 2019-07-23 NOTE — Telephone Encounter (Signed)
Fax received for refill have not seen patient in over a year. Ok to send in 1 90 day and have make app?

## 2019-07-23 NOTE — Telephone Encounter (Signed)
Scheduled appt for pt 

## 2019-07-24 DIAGNOSIS — G4733 Obstructive sleep apnea (adult) (pediatric): Secondary | ICD-10-CM | POA: Diagnosis not present

## 2019-07-24 DIAGNOSIS — E119 Type 2 diabetes mellitus without complications: Secondary | ICD-10-CM | POA: Diagnosis not present

## 2019-07-24 DIAGNOSIS — D649 Anemia, unspecified: Secondary | ICD-10-CM | POA: Diagnosis not present

## 2019-07-24 DIAGNOSIS — I509 Heart failure, unspecified: Secondary | ICD-10-CM | POA: Diagnosis not present

## 2019-07-24 DIAGNOSIS — I2510 Atherosclerotic heart disease of native coronary artery without angina pectoris: Secondary | ICD-10-CM | POA: Diagnosis not present

## 2019-07-24 DIAGNOSIS — J449 Chronic obstructive pulmonary disease, unspecified: Secondary | ICD-10-CM | POA: Diagnosis not present

## 2019-07-24 DIAGNOSIS — Z7982 Long term (current) use of aspirin: Secondary | ICD-10-CM | POA: Diagnosis not present

## 2019-07-24 DIAGNOSIS — Z96641 Presence of right artificial hip joint: Secondary | ICD-10-CM | POA: Diagnosis not present

## 2019-07-24 DIAGNOSIS — D369 Benign neoplasm, unspecified site: Secondary | ICD-10-CM | POA: Diagnosis not present

## 2019-07-24 DIAGNOSIS — K219 Gastro-esophageal reflux disease without esophagitis: Secondary | ICD-10-CM | POA: Diagnosis not present

## 2019-07-24 DIAGNOSIS — Z471 Aftercare following joint replacement surgery: Secondary | ICD-10-CM | POA: Diagnosis not present

## 2019-07-24 DIAGNOSIS — Z9181 History of falling: Secondary | ICD-10-CM | POA: Diagnosis not present

## 2019-07-25 DIAGNOSIS — J449 Chronic obstructive pulmonary disease, unspecified: Secondary | ICD-10-CM | POA: Diagnosis not present

## 2019-07-25 DIAGNOSIS — I2510 Atherosclerotic heart disease of native coronary artery without angina pectoris: Secondary | ICD-10-CM | POA: Diagnosis not present

## 2019-07-25 DIAGNOSIS — Z96641 Presence of right artificial hip joint: Secondary | ICD-10-CM | POA: Diagnosis not present

## 2019-07-25 DIAGNOSIS — D649 Anemia, unspecified: Secondary | ICD-10-CM | POA: Diagnosis not present

## 2019-07-25 DIAGNOSIS — D369 Benign neoplasm, unspecified site: Secondary | ICD-10-CM | POA: Diagnosis not present

## 2019-07-25 DIAGNOSIS — Z7982 Long term (current) use of aspirin: Secondary | ICD-10-CM | POA: Diagnosis not present

## 2019-07-25 DIAGNOSIS — Z9181 History of falling: Secondary | ICD-10-CM | POA: Diagnosis not present

## 2019-07-25 DIAGNOSIS — G4733 Obstructive sleep apnea (adult) (pediatric): Secondary | ICD-10-CM | POA: Diagnosis not present

## 2019-07-25 DIAGNOSIS — I509 Heart failure, unspecified: Secondary | ICD-10-CM | POA: Diagnosis not present

## 2019-07-25 DIAGNOSIS — E119 Type 2 diabetes mellitus without complications: Secondary | ICD-10-CM | POA: Diagnosis not present

## 2019-07-25 DIAGNOSIS — K219 Gastro-esophageal reflux disease without esophagitis: Secondary | ICD-10-CM | POA: Diagnosis not present

## 2019-07-25 DIAGNOSIS — Z471 Aftercare following joint replacement surgery: Secondary | ICD-10-CM | POA: Diagnosis not present

## 2019-07-26 ENCOUNTER — Other Ambulatory Visit: Payer: Self-pay

## 2019-07-26 ENCOUNTER — Encounter: Payer: Self-pay | Admitting: Orthopaedic Surgery

## 2019-07-26 ENCOUNTER — Ambulatory Visit (INDEPENDENT_AMBULATORY_CARE_PROVIDER_SITE_OTHER): Payer: BC Managed Care – PPO | Admitting: Orthopaedic Surgery

## 2019-07-26 DIAGNOSIS — Z96641 Presence of right artificial hip joint: Secondary | ICD-10-CM

## 2019-07-26 NOTE — Progress Notes (Signed)
The patient is 13 days status post a right total hip arthroplasty.  He is ambulating with a cane.  He has been released from home health therapy but does wish to proceed to outpatient therapy just due to stiffness.  He did have a postoperative rash along his back so he stopped all medications.  He says he does not really need to needed any medications.  He said the rash is resolved.  He says his pain is only mild to moderate.  He is ready to drive because he is off all narcotics.  On exam his right hip incision looks good so the staples have been removed and Steri-Strips applied.  He does have a mild seroma.  I recommended he try heat on this area at least twice a day for about 30 minutes with cough and not having the heat directly on the skin.  He would like to transition outpatient physical therapy so I gave him a prescription for outpatient physical therapy in [facility] which is near to his home.  All question concerns were answered and addressed.  We will see him back in 4 weeks to see how he is doing overall but no x-rays are needed.

## 2019-07-27 ENCOUNTER — Encounter: Payer: Self-pay | Admitting: Internal Medicine

## 2019-07-27 ENCOUNTER — Ambulatory Visit: Payer: BC Managed Care – PPO | Admitting: Internal Medicine

## 2019-07-27 VITALS — BP 110/76 | HR 74 | Temp 97.4°F | Ht 71.0 in | Wt 199.8 lb

## 2019-07-27 DIAGNOSIS — K219 Gastro-esophageal reflux disease without esophagitis: Secondary | ICD-10-CM

## 2019-07-27 DIAGNOSIS — G4733 Obstructive sleep apnea (adult) (pediatric): Secondary | ICD-10-CM | POA: Diagnosis not present

## 2019-07-27 NOTE — Patient Instructions (Signed)
Order- DME [facility]   Please replace old CPAP machine, auto 5-15, mask of choice, humidifier, supplies, AirView/ card  Please call if we can help

## 2019-07-27 NOTE — Progress Notes (Signed)
07/27/19- 58 yoM never smoker for sleep evaluation Previously followed by Dr Halford Chessman, last seen in 2017 and now coming to re-establish. Machine was very old in 2017 and not replaced then. PSG 03/02/07 >> AHI 38.6, SpO2 low 82%; CPAP 14 cm H2O >> AHI 0 Auto CPAP 01/01/16 to 01/30/16 >> used on 5 of 25 nights with average 4 hrs 4 min.  Average AHI 10.2 with mean CPAP 13 and 90 th percentile CPAP 15 cm H2O Medical problem list includes GERD, Osteoarthritis R THR 07/13/19 CPAP  8/    [facility] Body weight today 199 lbs Epworth score 11 Sleeps only on his back, with no parasomnias. Uses CPAP every night and prevents snoring. Sometimes tired in day. Has pulled over for nap on long drives. 1.5 cups coffee/ day. No sleep meds. Asked about melatonin- discussed. He feels he sleeps fairly soundly through the night.  Breathes comfortably through nose. Denies heart/ lung/ thyroid disorder or hx seizures.  Working mostly from [place].  Prior to Admission medications   Medication Sig Start Date End Date Taking? Authorizing Provider  ciclopirox (PENLAC) 8 % solution APPLY OVER NAIL AND SURROUNDING SKIN. APPLY DAILY OVER PREVIOUS COAT. AFTER SEVEN (7) DAYS, MAY REMOVE WITH ALCOHOL AND CONTINUE CYCLE. 07/17/19  Yes Trula Slade, DPM  omeprazole (PRILOSEC) 20 MG capsule Take 1 capsule (20 mg total) by mouth daily. 07/23/19  Yes Leamon Arnt, MD   Past Medical History:  Diagnosis Date  . Adenomatous polyps 05/18/2018  . GERD (gastroesophageal reflux disease)   . High cholesterol   . OSA (obstructive sleep apnea)   . Sleep apnea    cpap every night   Past Surgical History:  Procedure Laterality Date  . COLONOSCOPY    . DUPUYTREN CONTRACTURE RELEASE Right 10/19/2016   Procedure: RIGHT SMALL FINGER Z-PLASTY, PROXIMAL INTERPHALANGEAL JOINT RELEASE;  Surgeon: Leanora Cover, MD;  Location: [place];  Service: Orthopedics;  Laterality: Right;  axillary block in preop  . ESOPHAGEAL  DILATION    . INGUINAL HERNIA REPAIR     right side  . PERCUTANEOUS PINNING Right 06/26/2016   Procedure: RIGHT SMALL FINGER IRRIGATION AND DEBRIDEMENT, POSSIBLE PINNING OF RIGHT SMALL FINGER FRACTURE;  Surgeon: Leanora Cover, MD;  Location: [place];  Service: Orthopedics;  Laterality: Right;  . POLYPECTOMY    . TOTAL HIP ARTHROPLASTY Right 07/13/2019   Procedure: RIGHT TOTAL HIP ARTHROPLASTY ANTERIOR APPROACH;  Surgeon: Mcarthur Rossetti, MD;  Location: [facility];  Service: Orthopedics;  Laterality: Right;   Family History  Problem Relation Age of Onset  . Prostate cancer Father   . Leukemia Son 2       cured  . Colon cancer Neg Hx   . Colon polyps Neg Hx   . Rectal cancer Neg Hx   . Stomach cancer Neg Hx   . Esophageal cancer Neg Hx   . Hypertension Neg Hx   . Diabetes Neg Hx    Social History   Socioeconomic History  . Marital status: Married    Spouse name: 2  . Number of children: Not on file  . Years of education: Not on file  . Highest education level: Not on file  Occupational History  . Occupation: Environmental education officer, travels    Fish farm manager: [employer].  Tobacco Use  . Smoking status: Never Smoker  . Smokeless tobacco: Never Used  Substance and Sexual Activity  . Alcohol use: Yes    Comment: occasionally  .  Drug use: No  . Sexual activity: Not on file  Other Topics Concern  . Not on file  Social History Narrative  . Not on file   Social Determinants of Health   Financial Resource Strain:   . Difficulty of Paying Living Expenses: Not on file  Food Insecurity:   . Worried About Charity fundraiser in the Last Year: Not on file  . Ran Out of Food in the Last Year: Not on file  Transportation Needs:   . Lack of Transportation (Medical): Not on file  . Lack of Transportation (Non-Medical): Not on file  Physical Activity:   . Days of Exercise per Week: Not on file  . Minutes of Exercise per Session: Not on file  Stress:   . Feeling of Stress : Not  on file  Social Connections:   . Frequency of Communication with Friends and Family: Not on file  . Frequency of Social Gatherings with Friends and Family: Not on file  . Attends Religious Services: Not on file  . Active Member of Clubs or Organizations: Not on file  . Attends Archivist Meetings: Not on file  . Marital Status: Not on file  Intimate Partner Violence:   . Fear of Current or Ex-Partner: Not on file  . Emotionally Abused: Not on file  . Physically Abused: Not on file  . Sexually Abused: Not on file   ROS-see HPI   + - positive Constitutional:    weight loss, night sweats, fevers, chills, fatigue, lassitude. HEENT:    headaches, difficulty swallowing, +tooth/dental problems, sore throat,       sneezing, itching, ear ache, nasal congestion, post nasal drip, snoring CV:    chest pain, orthopnea, PND, swelling in lower extremities, anasarca,                                  dizziness, palpitations Resp:   shortness of breath with exertion or at rest.                productive cough,   non-productive cough, coughing up of blood.              change in color of mucus.  wheezing.   Skin:    rash or lesions. GI:  No-   heartburn, indigestion, abdominal pain, nausea, vomiting, diarrhea,                 change in bowel habits, loss of appetite GU: dysuria, change in color of urine, no urgency or frequency.   flank pain. MS:   joint pain, stiffness, decreased range of motion, back pain. Neuro-     nothing unusual Psych:  change in mood or affect.  depression or anxiety.   memory loss.  OBJ- Physical Exam General- Alert, Oriented, Affect-appropriate, Distress- none acute, not obese Skin- rash-none, lesions- none, excoriation- none Lymphadenopathy- none Head- atraumatic            Eyes- Gross vision intact, PERRLA, conjunctivae and secretions clear            Ears- Hearing, canals-normal            Nose- Clear, no-Septal dev, mucus, polyps, erosion, perforation              Throat- Mallampati II , mucosa clear , drainage- none, tonsils- atrophic, + teeth Neck- flexible , trachea midline, no stridor , thyroid nl, carotid  no bruit Chest - symmetrical excursion , unlabored           Heart/CV- RRR , no murmur , no gallop  , no rub, nl s1 s2                           - JVD- none , edema- none, stasis changes- none, varices- none           Lung- clear to P&A, wheeze- none, cough- none , dullness-none, rub- none           Chest wall-  Abd-  Br/ Gen/ Rectal- Not done, not indicated Extrem- cyanosis- none, clubbing, none, atrophy- none, strength- nl + Limping/ cane after recent hip surgery Neuro- grossly intact to observation

## 2019-07-27 NOTE — Assessment & Plan Note (Signed)
He continues prilosec and does not indicate this is waking him at night.

## 2019-07-27 NOTE — Assessment & Plan Note (Signed)
He has found mask style he likes and reports using CPAP every night. Machine is very old. We will replace and change to autopap.  Discussed cleaning machines. Discussed residual tiredness and can reassess whenhe has new machine.Melatonin ok if he wants to try it.  Plan- auto 5-15, replace old machine

## 2019-07-30 DIAGNOSIS — Z96641 Presence of right artificial hip joint: Secondary | ICD-10-CM | POA: Diagnosis not present

## 2019-07-30 DIAGNOSIS — M25551 Pain in right hip: Secondary | ICD-10-CM | POA: Diagnosis not present

## 2019-07-30 DIAGNOSIS — Z4789 Encounter for other orthopedic aftercare: Secondary | ICD-10-CM | POA: Diagnosis not present

## 2019-08-01 ENCOUNTER — Institutional Professional Consult (permissible substitution): Payer: BC Managed Care – PPO | Admitting: Pulmonary Disease

## 2019-08-02 DIAGNOSIS — Z4789 Encounter for other orthopedic aftercare: Secondary | ICD-10-CM | POA: Diagnosis not present

## 2019-08-02 DIAGNOSIS — M25551 Pain in right hip: Secondary | ICD-10-CM | POA: Diagnosis not present

## 2019-08-02 DIAGNOSIS — Z96641 Presence of right artificial hip joint: Secondary | ICD-10-CM | POA: Diagnosis not present

## 2019-08-06 DIAGNOSIS — G4733 Obstructive sleep apnea (adult) (pediatric): Secondary | ICD-10-CM | POA: Diagnosis not present

## 2019-08-07 DIAGNOSIS — Z96641 Presence of right artificial hip joint: Secondary | ICD-10-CM | POA: Diagnosis not present

## 2019-08-07 DIAGNOSIS — Z4789 Encounter for other orthopedic aftercare: Secondary | ICD-10-CM | POA: Diagnosis not present

## 2019-08-07 DIAGNOSIS — M25551 Pain in right hip: Secondary | ICD-10-CM | POA: Diagnosis not present

## 2019-08-09 ENCOUNTER — Institutional Professional Consult (permissible substitution): Payer: BC Managed Care – PPO | Admitting: Pulmonary Disease

## 2019-08-09 DIAGNOSIS — Z96641 Presence of right artificial hip joint: Secondary | ICD-10-CM | POA: Diagnosis not present

## 2019-08-09 DIAGNOSIS — M25551 Pain in right hip: Secondary | ICD-10-CM | POA: Diagnosis not present

## 2019-08-09 DIAGNOSIS — Z4789 Encounter for other orthopedic aftercare: Secondary | ICD-10-CM | POA: Diagnosis not present

## 2019-08-14 DIAGNOSIS — Z96641 Presence of right artificial hip joint: Secondary | ICD-10-CM | POA: Diagnosis not present

## 2019-08-14 DIAGNOSIS — Z4789 Encounter for other orthopedic aftercare: Secondary | ICD-10-CM | POA: Diagnosis not present

## 2019-08-14 DIAGNOSIS — M25551 Pain in right hip: Secondary | ICD-10-CM | POA: Diagnosis not present

## 2019-08-21 DIAGNOSIS — Z4789 Encounter for other orthopedic aftercare: Secondary | ICD-10-CM | POA: Diagnosis not present

## 2019-08-21 DIAGNOSIS — Z96641 Presence of right artificial hip joint: Secondary | ICD-10-CM | POA: Diagnosis not present

## 2019-08-21 DIAGNOSIS — M25551 Pain in right hip: Secondary | ICD-10-CM | POA: Diagnosis not present

## 2019-08-23 DIAGNOSIS — Z96641 Presence of right artificial hip joint: Secondary | ICD-10-CM | POA: Diagnosis not present

## 2019-08-23 DIAGNOSIS — Z4789 Encounter for other orthopedic aftercare: Secondary | ICD-10-CM | POA: Diagnosis not present

## 2019-08-23 DIAGNOSIS — M25551 Pain in right hip: Secondary | ICD-10-CM | POA: Diagnosis not present

## 2019-09-03 DIAGNOSIS — G4733 Obstructive sleep apnea (adult) (pediatric): Secondary | ICD-10-CM | POA: Diagnosis not present

## 2019-09-17 ENCOUNTER — Ambulatory Visit (INDEPENDENT_AMBULATORY_CARE_PROVIDER_SITE_OTHER): Payer: BC Managed Care – PPO | Admitting: Family Medicine

## 2019-09-17 ENCOUNTER — Other Ambulatory Visit: Payer: Self-pay

## 2019-09-17 ENCOUNTER — Encounter: Payer: Self-pay | Admitting: Family Medicine

## 2019-09-17 VITALS — BP 120/82 | HR 60 | Temp 98.2°F | Ht 70.0 in | Wt 203.0 lb

## 2019-09-17 DIAGNOSIS — M7581 Other shoulder lesions, right shoulder: Secondary | ICD-10-CM

## 2019-09-17 DIAGNOSIS — Z9989 Dependence on other enabling machines and devices: Secondary | ICD-10-CM

## 2019-09-17 DIAGNOSIS — G4733 Obstructive sleep apnea (adult) (pediatric): Secondary | ICD-10-CM

## 2019-09-17 DIAGNOSIS — Z96641 Presence of right artificial hip joint: Secondary | ICD-10-CM

## 2019-09-17 DIAGNOSIS — Z0000 Encounter for general adult medical examination without abnormal findings: Secondary | ICD-10-CM | POA: Diagnosis not present

## 2019-09-17 DIAGNOSIS — K219 Gastro-esophageal reflux disease without esophagitis: Secondary | ICD-10-CM

## 2019-09-17 DIAGNOSIS — D369 Benign neoplasm, unspecified site: Secondary | ICD-10-CM

## 2019-09-17 LAB — CBC WITH DIFFERENTIAL/PLATELET
Basophils Absolute: 0.1 10*3/uL (ref 0.0–0.1)
Basophils Relative: 1.6 % (ref 0.0–3.0)
Eosinophils Absolute: 0.3 10*3/uL (ref 0.0–0.7)
Eosinophils Relative: 5.4 % — ABNORMAL HIGH (ref 0.0–5.0)
HCT: 42.1 % (ref 39.0–52.0)
Hemoglobin: 14.5 g/dL (ref 13.0–17.0)
Lymphocytes Relative: 31.7 % (ref 12.0–46.0)
Lymphs Abs: 1.7 10*3/uL (ref 0.7–4.0)
MCHC: 34.3 g/dL (ref 30.0–36.0)
MCV: 92.4 fl (ref 78.0–100.0)
Monocytes Absolute: 0.7 10*3/uL (ref 0.1–1.0)
Monocytes Relative: 13 % — ABNORMAL HIGH (ref 3.0–12.0)
Neutro Abs: 2.6 10*3/uL (ref 1.4–7.7)
Neutrophils Relative %: 48.3 % (ref 43.0–77.0)
Platelets: 227 10*3/uL (ref 150.0–400.0)
RBC: 4.55 Mil/uL (ref 4.22–5.81)
RDW: 12.8 % (ref 11.5–15.5)
WBC: 5.3 10*3/uL (ref 4.0–10.5)

## 2019-09-17 LAB — COMPREHENSIVE METABOLIC PANEL
ALT: 23 U/L (ref 0–53)
AST: 20 U/L (ref 0–37)
Albumin: 4.1 g/dL (ref 3.5–5.2)
Alkaline Phosphatase: 89 U/L (ref 39–117)
BUN: 17 mg/dL (ref 6–23)
CO2: 28 mEq/L (ref 19–32)
Calcium: 9.5 mg/dL (ref 8.4–10.5)
Chloride: 102 mEq/L (ref 96–112)
Creatinine, Ser: 0.78 mg/dL (ref 0.40–1.50)
GFR: 102.33 mL/min (ref >60.00)
Glucose, Bld: 97 mg/dL (ref 70–99)
Potassium: 4.6 mEq/L (ref 3.5–5.1)
Sodium: 136 mEq/L (ref 135–145)
Total Bilirubin: 0.7 mg/dL (ref 0.2–1.2)
Total Protein: 6.7 g/dL (ref 6.0–8.3)

## 2019-09-17 LAB — LIPID PANEL
Cholesterol: 219 mg/dL — ABNORMAL HIGH (ref 0–200)
HDL: 46 mg/dL (ref >39.00)
LDL Cholesterol: 154 mg/dL — ABNORMAL HIGH (ref 0–99)
NonHDL: 173.36
Total CHOL/HDL Ratio: 5
Triglycerides: 97 mg/dL (ref 0.0–149.0)
VLDL: 19.4 mg/dL (ref 0.0–40.0)

## 2019-09-17 MED ORDER — DICLOFENAC SODIUM 75 MG PO TBEC: 75.0000 mg | DELAYED_RELEASE_TABLET | Freq: Two times a day (BID) | ORAL | 0 refills | Status: DC

## 2019-09-17 MED ORDER — OMEPRAZOLE 20 MG PO CPDR: 20.0000 mg | DELAYED_RELEASE_CAPSULE | Freq: Every day | ORAL | 3 refills | Status: DC

## 2019-09-17 NOTE — Progress Notes (Signed)
Subjective  Chief Complaint  Patient presents with  . Annual Exam    not fasting. right shoulder painful  . Gastroesophageal Reflux    controlling with omeprazole 20 mg  . Sleep Apnea    new machine. sees specialist. still does not sleep more than 5 hours a night    HPI: Norman Dunn is a 59 y.o. male who presents to [facility] today for a Male Wellness Visit. He also has the concerns and/or needs as listed above in the chief complaint. These will be addressed in addition to the Health Maintenance Visit.   Wellness Visit: annual visit with health maintenance review and exam    HM: doing well. Feels fine. S/p hip replacement on right in January. Recovered well. Completed PT. Screens up to date.  Lifestyle: Body mass index is 29.13 kg/m. Wt Readings from Last 3 Encounters:  09/17/19 203 lb (92.1 kg)  07/27/19 199 lb 12.8 oz (90.6 kg)  07/13/19 207 lb 0.2 oz (93.9 kg)   Diet: general Exercise: rarely,   Chronic disease management visit and/or acute problem visit:  C/o right shoulder pain x 8-9 months. Started after holding on to tractor to catch or prevent a fall off it. Felt acute pain; has improved but still with significant pain/ache daily and increases with certain movements. No weakness. No neck pain.   GERD is controlled on chronic ppi. Needs refills.  Reviewed pulm notes: new CPAP machine and doing fine. Sleeps about 5 hours/night. This is his norm. Falls asleep easily and stays asleep. Feels rested in am.    Patient Active Problem List   Diagnosis Date Noted  . Adenomatous polyps 05/18/2018  . OSA on CPAP 09/07/2012  . Gastroesophageal reflux disease without esophagitis 07/12/2013  . Status post total replacement of right hip 07/26/2019  . Genital herpes 05/06/2014   Health Maintenance  Topic Date Due  . COLONOSCOPY  02/22/2021  . TETANUS/TDAP  05/18/2028  . INFLUENZA VACCINE  Completed  . Hepatitis C Screening  Completed  . HIV  Screening  Completed   Immunization History  Administered Date(s) Administered  . Influenza Inj Mdck Quad Pf 03/29/2019  . Influenza Whole 03/28/2012, 03/28/2013  . Influenza,inj,Quad PF,6+ Mos 04/20/2018, 03/29/2019  . Influenza-Unspecified 03/28/2014, 05/06/2014  . Tdap 05/18/2018   We updated and reviewed the patient's past history in detail and it is documented below. Allergies: Patient has No Known Allergies. Past Medical History  has a past medical history of Adenomatous polyps (05/18/2018), GERD (gastroesophageal reflux disease), High cholesterol, OSA (obstructive sleep apnea), Sleep apnea, and Unilateral primary osteoarthritis, right hip (02/16/2018). Past Surgical History Patient  has a past surgical history that includes Inguinal hernia repair; Colonoscopy; Polypectomy; Percutaneous pinning (Right, 06/26/2016); Esophageal dilation; Dupuytren contracture release (Right, 10/19/2016); and Total hip arthroplasty (Right, 07/13/2019). Social History Patient  reports that he has never smoked. He has never used smokeless tobacco. He reports current alcohol use. He reports that he does not use drugs. Family History family history includes Leukemia (age of onset: 2) in his son; Prostate cancer in his father. Review of Systems: Constitutional: negative for fever or malaise Ophthalmic: negative for photophobia, double vision or loss of vision Cardiovascular: negative for chest pain, dyspnea on exertion, or new LE swelling Respiratory: negative for SOB or persistent cough Gastrointestinal: negative for abdominal pain, change in bowel habits or melena Genitourinary: negative for dysuria or gross hematuria Musculoskeletal: negative for new gait disturbance or muscular weakness Integumentary: negative for  new or persistent rashes Neurological: negative for TIA or stroke symptoms Psychiatric: negative for SI or delusions Allergic/Immunologic: negative for hives  Patient Care Team     Relationship Specialty Notifications Start End  Leamon Arnt, MD PCP - General Family Medicine  05/18/18   Milus Banister, MD Attending Physician Gastroenterology  05/18/18   Otelia Sergeant, OD Referring Physician Optometry  05/18/18   Mcarthur Rossetti, MD Consulting Physician Orthopedic Surgery  09/17/19    Objective  Vitals: BP 120/82 (BP Location: Right Arm, Patient Position: Sitting, Cuff Size: Normal)   Pulse 60   Temp 98.2 F (36.8 C) (Temporal)   Ht 5\' 10"  (1.778 m)   Wt 203 lb (92.1 kg)   SpO2 97%   BMI 29.13 kg/m  General:  Well developed, well nourished, no acute distress  Psych:  Alert and orientedx3,normal mood and affect HEENT:  Normocephalic, atraumatic, non-icteric sclera, PERRL, oropharynx is clear without mass or exudate, supple neck without adenopathy, mass or thyromegaly Cardiovascular:  Normal S1, S2, RRR without gallop, rub or murmur, nondisplaced PMI, +2 distal pulses in bilateral upper and lower extremities. Respiratory:  Good breath sounds bilaterally, CTAB with normal respiratory effort Gastrointestinal: normal bowel sounds, soft, non-tender, no noted masses. No HSM MSK: no contusions. Joints are without erythema or swelling. rigth 5th pinky finger with deformity from prior injury; right shoulder: FROM w/ pain with abduction > 90. + empty can testing and impingement. Spine and CVA region are nontender Skin:  Warm, no rashes or suspicious lesions noted Neurologic:    Mental status is normal. Stable gait. No tremor GU: No inguinal hernias or adenopathy are appreciated bilaterally   Assessment  1. Annual physical exam   2. Adenomatous polyps   3. OSA on CPAP   4. Gastroesophageal reflux disease without esophagitis   5. Status post total replacement of right hip   6. Right rotator cuff tendonitis      Plan  Male Wellness Visit:  Age appropriate Health Maintenance and Prevention measures were discussed with patient. Included topics are cancer  screening recommendations, ways to keep healthy (see AVS) including dietary and exercise recommendations, regular eye and dental care, use of seat belts, and avoidance of moderate alcohol use and tobacco use.   BMI: discussed patient's BMI and encouraged positive lifestyle modifications to help get to or maintain a target BMI.  HM needs and immunizations were addressed and ordered. See below for orders. See HM and immunization section for updates.  Routine labs and screening tests ordered including cmp, cbc and lipids where appropriate.  Discussed recommendations regarding Vit D and calcium supplementation (see AVS)  Chronic disease f/u and/or acute problem visit: (deemed necessary to be done in addition to the wellness visit):  RCT: educated. Trial of 2 week course of nsaids and shoulder exercises. F/u if not improving for further eval.   GERD: on PPI and controlled  OSA: stable w/ recent evaluation by pulm  OA hip resolved with THR.  Adenomatous polyps: up to date on screens  Follow up: Return in about 1 year (around 09/16/2020) for complete physical.   Commons side effects, risks, benefits, and alternatives for medications and treatment plan prescribed today were discussed, and the patient expressed understanding of the given instructions. Patient is instructed to call or message via MyChart if he/she has any questions or concerns regarding our treatment plan. No barriers to understanding were identified. We discussed Red Flag symptoms and signs in detail. Patient expressed understanding regarding  what to do in case of urgent or emergency type symptoms.   Medication list was reconciled, printed and provided to the patient in AVS. Patient instructions and summary information was reviewed with the patient as documented in the AVS. This note was prepared with assistance of Dragon voice recognition software. Occasional wrong-word or sound-a-like substitutions may have occurred due to the  inherent limitations of voice recognition software  This visit occurred during the SARS-CoV-2 public health emergency.  Safety protocols were in place, including screening questions prior to the visit, additional usage of staff PPE, and extensive cleaning of exam room while observing appropriate contact time as indicated for disinfecting solutions.   Orders Placed This Encounter  Procedures  . CBC with Differential/Platelet  . Comprehensive metabolic panel  . Lipid panel   Meds ordered this encounter  Medications  . omeprazole (PRILOSEC) 20 MG capsule    Sig: Take 1 capsule (20 mg total) by mouth daily.    Dispense:  90 capsule    Refill:  3  . diclofenac (VOLTAREN) 75 MG EC tablet    Sig: Take 1 tablet (75 mg total) by mouth 2 (two) times daily.    Dispense:  30 tablet    Refill:  0

## 2019-09-17 NOTE — Patient Instructions (Signed)
Please return in 12 months for your annual complete physical; please come fasting. Sooner if your right shoulder is not improving.   I will release your lab results to you on your MyChart account with further instructions. Please reply with any questions.    If you have any questions or concerns, please don't hesitate to send me a message via MyChart or call the office at [phone]. Thank you for visiting with [place] today! It's our pleasure caring for you.   Fat and Cholesterol Restricted Eating Plan Getting too much fat and cholesterol in your diet may cause health problems. Choosing the right foods helps keep your fat and cholesterol at normal levels. This can keep you from getting certain diseases.  What are tips for following this plan? Meal planning  At meals, divide your plate into four equal parts: ? Fill one-half of your plate with vegetables and green salads. ? Fill one-fourth of your plate with whole grains. ? Fill one-fourth of your plate with low-fat (lean) protein foods.  Eat fish that is high in omega-3 fats at least two times a week. This includes mackerel, tuna, sardines, and salmon.  Eat foods that are high in fiber, such as whole grains, beans, apples, broccoli, carrots, peas, and barley. General tips   Work with your doctor to lose weight if you need to.  Avoid: ? Foods with added sugar. ? Fried foods. ? Foods with partially hydrogenated oils.  Limit alcohol intake to no more than 1 drink a day for nonpregnant women and 2 drinks a day for men. One drink equals 12 oz of beer, 5 oz of wine, or 1 oz of hard liquor. Reading food labels  Check food labels for: ? Trans fats. ? Partially hydrogenated oils. ? Saturated fat (g) in each serving. ? Cholesterol (mg) in each serving. ? Fiber (g) in each serving.  Choose foods with healthy fats, such as: ? Monounsaturated fats. ? Polyunsaturated fats. ? Omega-3 fats.  Choose grain products that have whole grains.  Look for the word "whole" as the first word in the ingredient list. Cooking  Cook foods using low-fat methods. These include baking, boiling, grilling, and broiling.  Eat more home-cooked foods. Eat at restaurants and buffets less often.  Avoid cooking using saturated fats, such as butter, cream, palm oil, palm kernel oil, and coconut oil. Recommended foods  Fruits  All fresh, canned (in natural juice), or frozen fruits. Vegetables  Fresh or frozen vegetables (raw, steamed, roasted, or grilled). Green salads. Grains  Whole grains, such as whole wheat or whole grain breads, crackers, cereals, and pasta. Unsweetened oatmeal, bulgur, barley, quinoa, or brown rice. Corn or whole wheat flour tortillas. Meats and other protein foods  Ground beef (85% or leaner), grass-fed beef, or beef trimmed of fat. Skinless chicken or [place]. Ground chicken or [place]. Pork trimmed of fat. All fish and seafood. Egg whites. Dried beans, peas, or lentils. Unsalted nuts or seeds. Unsalted canned beans. Nut butters without added sugar or oil. Dairy  Low-fat or nonfat dairy products, such as skim or 1% milk, 2% or reduced-fat cheeses, low-fat and fat-free ricotta or cottage cheese, or plain low-fat and nonfat yogurt. Fats and oils  Tub margarine without trans fats. Light or reduced-fat mayonnaise and salad dressings. Avocado. Olive, canola, sesame, or safflower oils. The items listed above may not be a complete list of foods and beverages you can eat. Contact a dietitian for more information. Foods to avoid Fruits  Canned fruit in heavy  syrup. Fruit in cream or butter sauce. Fried fruit. Vegetables  Vegetables cooked in cheese, cream, or butter sauce. Fried vegetables. Grains  White bread. White pasta. White rice. Cornbread. Bagels, pastries, and croissants. Crackers and snack foods that contain trans fat and hydrogenated oils. Meats and other protein foods  Fatty cuts of meat. Ribs, chicken wings,  bacon, sausage, bologna, salami, chitterlings, fatback, hot dogs, bratwurst, and packaged lunch meats. Liver and organ meats. Whole eggs and egg yolks. Chicken and [place] with skin. Fried meat. Dairy  Whole or 2% milk, cream, half-and-half, and cream cheese. Whole milk cheeses. Whole-fat or sweetened yogurt. Full-fat cheeses. Nondairy creamers and whipped toppings. Processed cheese, cheese spreads, and cheese curds. Beverages  Alcohol. Sugar-sweetened drinks such as sodas, lemonade, and fruit drinks. Fats and oils  Butter, stick margarine, lard, shortening, ghee, or bacon fat. Coconut, palm kernel, and palm oils. Sweets and desserts  Corn syrup, sugars, honey, and molasses. Candy. Jam and jelly. Syrup. Sweetened cereals. Cookies, pies, cakes, donuts, muffins, and ice cream. The items listed above may not be a complete list of foods and beverages you should avoid. Contact a dietitian for more information. Summary  Choosing the right foods helps keep your fat and cholesterol at normal levels. This can keep you from getting certain diseases.  At meals, fill one-half of your plate with vegetables and green salads.  Eat high-fiber foods, like whole grains, beans, apples, carrots, peas, and barley.  Limit added sugar, saturated fats, alcohol, and fried foods. This information is not intended to replace advice given to you by your health care provider. Make sure you discuss any questions you have with your health care provider. Document Revised: 02/15/2018 Document Reviewed: 03/01/2017 Elsevier Patient Education  [place].

## 2019-10-04 ENCOUNTER — Ambulatory Visit: Payer: BC Managed Care – PPO | Attending: Internal Medicine

## 2019-10-04 DIAGNOSIS — Z23 Encounter for immunization: Secondary | ICD-10-CM

## 2019-10-04 DIAGNOSIS — G4733 Obstructive sleep apnea (adult) (pediatric): Secondary | ICD-10-CM | POA: Diagnosis not present

## 2019-10-04 NOTE — Progress Notes (Signed)
   Covid-19 Vaccination Clinic  Name:  Norman Dunn    MRN: [id number] DOB: January 26, 1961  10/04/2019  Norman Dunn was observed post Covid-19 immunization for 15 minutes without incident. He was provided with Vaccine Information Sheet and instruction to access the V-Safe system.   Norman Dunn was instructed to call 911 with any severe reactions post vaccine: Marland Kitchen Difficulty breathing  . Swelling of face and throat  . A fast heartbeat  . A bad rash all over body  . Dizziness and weakness   Immunizations Administered    Name Date Dose VIS Date Route   Pfizer COVID-19 Vaccine 10/04/2019  9:57 AM 0.3 mL 06/08/2019 Intramuscular   Manufacturer: [place]   Lot: [id number]   [place]: [id number]

## 2019-10-08 ENCOUNTER — Other Ambulatory Visit: Payer: Self-pay

## 2019-10-08 ENCOUNTER — Encounter: Payer: Self-pay | Admitting: Physician Assistant

## 2019-10-08 ENCOUNTER — Ambulatory Visit (INDEPENDENT_AMBULATORY_CARE_PROVIDER_SITE_OTHER): Payer: BC Managed Care – PPO | Admitting: Physician Assistant

## 2019-10-08 DIAGNOSIS — M25511 Pain in right shoulder: Secondary | ICD-10-CM

## 2019-10-08 DIAGNOSIS — Z96641 Presence of right artificial hip joint: Secondary | ICD-10-CM

## 2019-10-08 NOTE — Progress Notes (Signed)
Office Visit Note   Patient: Norman Dunn           Date of Birth: 01-04-1961           MRN: [id number] Visit Date: 10/08/2019              Requested by: Leamon Arnt, [place] [place],  [place] [id number] PCP: Leamon Arnt, MD   Assessment & Plan: Visit Diagnoses:  1. Status post total replacement of right hip   2. Right shoulder pain, unspecified chronicity     Plan: Discussed with him treatment for his right shoulder pain.  He defers on subacromial injection.  Therefore we will give him additional exercises to do at home as he has been given exercises for his shoulder by therapy.  These were reviewed with him.  He already has Thera-Band at home to work with.  We will see him back in 3 months for evaluation of his right total hip replacement.  At that time we will also reevaluate his shoulder.  He can always return sooner if his pain becomes worse in the right shoulder.  Questions were encouraged and answered  Follow-Up Instructions: Return in about 3 months (around 01/07/2020).   Orders:  No orders of the defined types were placed in this encounter.  No orders of the defined types were placed in this encounter.     Procedures: No procedures performed   Clinical Data: No additional findings.   Subjective: Chief Complaint  Patient presents with  . Right Hip - Follow-up    HPI [name] returns now 12 weeks 3 days status post right total hip arthroplasty.  He states that he has been doing well.  He is concerned about a spot at the top of the incision but thinks this may be a piece of suture.  He has had no fevers chills shortness of breath.  Is also having right shoulder pain that is been ongoing for several months.  Does report that the may have had an injury to that shoulder getting on his bobcat.  No radicular symptoms down the arm.  Pain with AB duction of the shoulder.  Review of Systems See HPI otherwise negative  Objective: Vital Signs:  There were no vitals taken for this visit.  Physical Exam General: Well-developed well-nourished male no acute distress Psych: Alert and oriented x3 Ortho Exam Right hip surgical incisions well-healed.  Proximal incision small spit stitch.  But no signs of infection.  This is removed after prep with Betadine.  Patient tolerates well.  Good range of motion of the right hip without pain. Bilateral shoulders full range of motion.  5-5 strength external/internal rotation against resistance.  Positive impingement testing on the right negative on the left.  Empty can test negative bilaterally.  Liftoff test negative bilaterally. Specialty Comments:  No specialty comments available.  Imaging: No results found.   PMFS History: Patient Active Problem List   Diagnosis Date Noted  . Status post total replacement of right hip 07/26/2019  . Adenomatous polyps 05/18/2018  . Genital herpes 05/06/2014  . Gastroesophageal reflux disease without esophagitis 07/12/2013  . OSA on CPAP 09/07/2012   Past Medical History:  Diagnosis Date  . Adenomatous polyps 05/18/2018  . GERD (gastroesophageal reflux disease)   . High cholesterol   . OSA (obstructive sleep apnea)   . Sleep apnea    cpap every night  . Unilateral primary osteoarthritis, right hip 02/16/2018    Family  History  Problem Relation Age of Onset  . Prostate cancer Father   . Leukemia Son 2       cured  . Colon cancer Neg Hx   . Colon polyps Neg Hx   . Rectal cancer Neg Hx   . Stomach cancer Neg Hx   . Esophageal cancer Neg Hx   . Hypertension Neg Hx   . Diabetes Neg Hx     Past Surgical History:  Procedure Laterality Date  . COLONOSCOPY    . DUPUYTREN CONTRACTURE RELEASE Right 10/19/2016   Procedure: RIGHT SMALL FINGER Z-PLASTY, PROXIMAL INTERPHALANGEAL JOINT RELEASE;  Surgeon: Leanora Cover, MD;  Location: [facility];  Service: Orthopedics;  Laterality: Right;  axillary block in preop  . ESOPHAGEAL DILATION    .  INGUINAL HERNIA REPAIR     right side  . PERCUTANEOUS PINNING Right 06/26/2016   Procedure: RIGHT SMALL FINGER IRRIGATION AND DEBRIDEMENT, POSSIBLE PINNING OF RIGHT SMALL FINGER FRACTURE;  Surgeon: Leanora Cover, MD;  Location: [facility];  Service: Orthopedics;  Laterality: Right;  . POLYPECTOMY    . TOTAL HIP ARTHROPLASTY Right 07/13/2019   Procedure: RIGHT TOTAL HIP ARTHROPLASTY ANTERIOR APPROACH;  Surgeon: Mcarthur Rossetti, MD;  Location: [facility];  Service: Orthopedics;  Laterality: Right;   Social History   Occupational History  . Occupation: Environmental education officer, travels    Fish farm manager: [employer].  Tobacco Use  . Smoking status: Never Smoker  . Smokeless tobacco: Never Used  Substance and Sexual Activity  . Alcohol use: Yes    Comment: occasionally  . Drug use: No  . Sexual activity: Not on file

## 2019-10-09 ENCOUNTER — Encounter: Payer: BC Managed Care – PPO | Admitting: Family Medicine

## 2019-10-29 ENCOUNTER — Ambulatory Visit: Payer: BC Managed Care – PPO | Attending: Internal Medicine

## 2019-10-29 DIAGNOSIS — Z23 Encounter for immunization: Secondary | ICD-10-CM

## 2019-10-29 NOTE — Progress Notes (Signed)
   Covid-19 Vaccination Clinic  Name:  PARSON CARSON    MRN: [id number] DOB: [birth year]  10/29/2019  Mr. Fairbairn was observed post Covid-19 immunization for 15 minutes without incident. He was provided with Vaccine Information Sheet and instruction to access the V-Safe system.   Mr. Meeds was instructed to call 911 with any severe reactions post vaccine: Marland Kitchen Difficulty breathing  . Swelling of face and throat  . A fast heartbeat  . A bad rash all over body  . Dizziness and weakness   Immunizations Administered    Name Date Dose VIS Date Route   Pfizer COVID-19 Vaccine 10/29/2019  8:33 AM 0.3 mL 08/22/2018 Intramuscular   Manufacturer: [place]   Lot: [id number]   [place]: [id number]

## 2019-11-26 ENCOUNTER — Encounter: Payer: Self-pay | Admitting: Internal Medicine

## 2019-11-27 ENCOUNTER — Other Ambulatory Visit: Payer: Self-pay

## 2019-11-27 ENCOUNTER — Encounter: Payer: Self-pay | Admitting: Internal Medicine

## 2019-11-27 ENCOUNTER — Ambulatory Visit: Payer: BC Managed Care – PPO | Admitting: Internal Medicine

## 2019-11-27 VITALS — BP 116/82 | HR 61 | Temp 98.3°F | Ht 71.0 in | Wt 205.8 lb

## 2019-11-27 DIAGNOSIS — Z9989 Dependence on other enabling machines and devices: Secondary | ICD-10-CM

## 2019-11-27 DIAGNOSIS — G4733 Obstructive sleep apnea (adult) (pediatric): Secondary | ICD-10-CM | POA: Diagnosis not present

## 2019-11-27 DIAGNOSIS — K219 Gastro-esophageal reflux disease without esophagitis: Secondary | ICD-10-CM

## 2019-11-27 NOTE — Patient Instructions (Signed)
Order- schedule Home Sleep Test   Dx OSA  We discussed trial of an otc sleep aid like Zzquil, to see if it would help you sleep a little longer.  We discussed trying a litle higher pressure range on your current CPAP, but decided to stay where we are for now.  We discussed availability of oral appliances for sleep apnea, instead of CPAP. I can steer you on that approach if you are interested.

## 2019-11-27 NOTE — Progress Notes (Signed)
HPI- 33 yoM never smoker followed for OSA, complicated by GERD, Osteoarthritis PSG 03/02/07 >> AHI 38.6, SpO2 low 82%; CPAP 14 cm H2O >> AHI 0, body weight 202 lbs  ---------------------------------------------------------------------------- 07/27/19- 58 yoM never smoker for sleep evaluation Previously followed by Dr Norman Dunn, last seen in 2017 and now coming to re-establish. Machine was very old in 2017 and not replaced then. PSG 03/02/07 >> AHI 38.6, SpO2 low 82%; CPAP 14 cm H2O >> AHI 0 Auto CPAP 01/01/16 to 01/30/16 >> used on 5 of 25 nights with average 4 hrs 4 min.  Average AHI 10.2 with mean CPAP 13 and 90 th percentile CPAP 15 cm H2O Medical problem list includes GERD, Osteoarthritis R THR 07/13/19 CPAP  8/    [facility] Body weight today 199 lbs Epworth score 11 Sleeps only on his back, with no parasomnias. Uses CPAP every night and prevents snoring. Sometimes tired in day. Has pulled over for nap on long drives. 1.5 cups coffee/ day. No sleep meds. Asked about melatonin- discussed. He feels he sleeps fairly soundly through the night.  Breathes comfortably through nose. Denies heart/ lung/ thyroid disorder or hx seizures.  Working mostly from [place].  11/27/19- 58 yoM never smoker followed for OSA, complicated by GERD, Osteoarthritis CPAP auto 5-15/ [facility]    replaced machine 07/27/19 Body weight today- 205 lbs Had 2 [employer] Covax Download compliance 83%, AHI 6.1/ hr He falls asleep quickly and sleep soundly 4-6 hours, life-long pattern. Admits sometimes drowsy evening TV, but no urge to nap and no problem staying awake when he needs to- driving. Wife thinks he should sleep longer. We discussed otc sleep meds.  He is somewhat restless, lean, but he denies thyroid problems.   ROS-see HPI   + - positive Constitutional:    weight loss, night sweats, fevers, chills, fatigue, lassitude. HEENT:    headaches, difficulty swallowing, +tooth/dental problems, sore throat,       sneezing,  itching, ear ache, nasal congestion, post nasal drip, snoring CV:    chest pain, orthopnea, PND, swelling in lower extremities, anasarca,                                  dizziness, palpitations Resp:   shortness of breath with exertion or at rest.                productive cough,   non-productive cough, coughing up of blood.              change in color of mucus.  wheezing.   Skin:    rash or lesions. GI:  No-   heartburn, indigestion, abdominal pain, nausea, vomiting, diarrhea,                 change in bowel habits, loss of appetite GU: dysuria, change in color of urine, no urgency or frequency.   flank pain. MS:   joint pain, stiffness, decreased range of motion, back pain. Neuro-     nothing unusual Psych:  change in mood or affect.  depression or anxiety.   memory loss.  OBJ- Physical Exam General- Alert, Oriented, Affect-appropriate, Distress- none acute, not obese Skin- rash-none, lesions- none, excoriation- none Lymphadenopathy- none Head- atraumatic            Eyes- Gross vision intact, PERRLA, conjunctivae and secretions clear            Ears- Hearing, canals-normal  Nose- Clear, no-Septal dev, mucus, polyps, erosion, perforation             Throat- Mallampati II , mucosa clear , drainage- none, tonsils- atrophic, + teeth Neck- flexible , trachea midline, no stridor , thyroid nl, carotid no bruit Chest - symmetrical excursion , unlabored           Heart/CV- RRR , no murmur , no gallop  , no rub, nl s1 s2                           - JVD- none , edema- none, stasis changes- none, varices- none           Lung- clear to P&A, wheeze- none, cough- none , dullness-none, rub- none           Chest wall-  Abd-  Br/ Gen/ Rectal- Not done, not indicated Extrem- cyanosis- none, clubbing, none, atrophy- none, strength- nl + Limping/ cane after recent hip surgery Neuro- grossly intact to observation

## 2019-11-27 NOTE — Assessment & Plan Note (Signed)
Pressure is staying close to top of range near 15, but he doesn't want to go higher. He would like to know how he does now w/o CPAP and will do an HST if not too expensive. We discussed alternatives to CPAP. Benefits from CPAP compared with baseline.  Plan- continue current CPAP 5-15 for now. Consider future trial 10-20.  HST to reassess.

## 2019-11-27 NOTE — Assessment & Plan Note (Signed)
Hx of stricture. Denies awareness of GERD causing sleep disturbance now.  Continue reflux precautions.

## 2019-12-05 DIAGNOSIS — G4733 Obstructive sleep apnea (adult) (pediatric): Secondary | ICD-10-CM | POA: Diagnosis not present

## 2020-01-04 DIAGNOSIS — G4733 Obstructive sleep apnea (adult) (pediatric): Secondary | ICD-10-CM | POA: Diagnosis not present

## 2020-01-07 ENCOUNTER — Ambulatory Visit: Payer: Self-pay

## 2020-01-07 ENCOUNTER — Ambulatory Visit: Payer: BC Managed Care – PPO | Admitting: Orthopaedic Surgery

## 2020-01-07 ENCOUNTER — Ambulatory Visit: Payer: BC Managed Care – PPO

## 2020-01-07 ENCOUNTER — Other Ambulatory Visit: Payer: Self-pay

## 2020-01-07 DIAGNOSIS — M25511 Pain in right shoulder: Secondary | ICD-10-CM

## 2020-01-07 DIAGNOSIS — Z96641 Presence of right artificial hip joint: Secondary | ICD-10-CM

## 2020-01-07 DIAGNOSIS — G8929 Other chronic pain: Secondary | ICD-10-CM

## 2020-01-07 DIAGNOSIS — G4733 Obstructive sleep apnea (adult) (pediatric): Secondary | ICD-10-CM

## 2020-01-07 NOTE — Progress Notes (Signed)
Office Visit Note   Patient: Norman Dunn           Date of Birth: June 15, 1961           MRN: [id number] Visit Date: 01/07/2020              Requested by: Leamon Arnt, [address] [place],  [place] [address] PCP: Leamon Arnt, MD   Assessment & Plan: Visit Diagnoses:  1. Chronic right shoulder pain   2. History of right hip replacement     Plan: Since he is doing so well at this point follow-up can be as needed.  I had a long thorough discussion about the things that would need to bring him back for his hip or his shoulder.  If he has any issues he will let us know.  All questions and concerns were answered and addressed.  Follow-Up Instructions: Return in about 4 weeks (around 02/04/2020).   Orders:  Orders Placed This Encounter  Procedures  . XR HIP UNILAT W OR W/O PELVIS 1V RIGHT  . XR Shoulder Right   No orders of the defined types were placed in this encounter.     Procedures: No procedures performed   Clinical Data: No additional findings.   Subjective: Chief Complaint  Patient presents with  . Right Hip - Follow-up  . Right Shoulder - Pain  The patient is now 6 months status post a right total hip arthroplasty.  He said the right hip is doing well and he has no issues with it at all.  He says he has good motion and good strength.  He says his right shoulder is improved quite a bit with exercises we showed him.  We have not x-rayed it before.  He says reaching overhead and behind him with the right shoulder is much better overall.  He does report that he and his wife are moving to [place] soon.  HPI  Review of Systems He currently denies any headache, chest pain, shortness of breath, fever, chills, nausea, vomiting  Objective: Vital Signs: There were no vitals taken for this visit.  Physical Exam He is alert and orient x3 and in no acute distress Ortho Exam Examination his right operative hip shows that it moves smoothly and fluidly.   His leg lengths are equal.  He has no pain.  Examination of his right shoulder is basically normal today with full range of motion no real discomfort. Specialty Comments:  No specialty comments available.  Imaging: XR HIP UNILAT W OR W/O PELVIS 1V RIGHT  Result Date: 01/07/2020 An AP pelvis lateral right hip shows a well-seated total hip arthroplasty with no complicating features.  XR Shoulder Right  Result Date: 01/07/2020 3 views of the right shoulder show no acute findings.  The shoulder is well located.    PMFS History: Patient Active Problem List   Diagnosis Date Noted  . Status post total replacement of right hip 07/26/2019  . Adenomatous polyps 05/18/2018  . Genital herpes 05/06/2014  . Gastroesophageal reflux disease without esophagitis 07/12/2013  . OSA on CPAP 09/07/2012   Past Medical History:  Diagnosis Date  . Adenomatous polyps 05/18/2018  . GERD (gastroesophageal reflux disease)   . High cholesterol   . OSA (obstructive sleep apnea)   . Sleep apnea    cpap every night  . Unilateral primary osteoarthritis, right hip 02/16/2018    Family History  Problem Relation Age of Onset  . Prostate cancer Father   .  Leukemia Son 2       cured  . Colon cancer Neg Hx   . Colon polyps Neg Hx   . Rectal cancer Neg Hx   . Stomach cancer Neg Hx   . Esophageal cancer Neg Hx   . Hypertension Neg Hx   . Diabetes Neg Hx     Past Surgical History:  Procedure Laterality Date  . COLONOSCOPY    . DUPUYTREN CONTRACTURE RELEASE Right 10/19/2016   Procedure: RIGHT SMALL FINGER Z-PLASTY, PROXIMAL INTERPHALANGEAL JOINT RELEASE;  Surgeon: Leanora Cover, MD;  Location: [facility];  Service: Orthopedics;  Laterality: Right;  axillary block in preop  . ESOPHAGEAL DILATION    . INGUINAL HERNIA REPAIR     right side  . PERCUTANEOUS PINNING Right 06/26/2016   Procedure: RIGHT SMALL FINGER IRRIGATION AND DEBRIDEMENT, POSSIBLE PINNING OF RIGHT SMALL FINGER FRACTURE;  Surgeon:  Leanora Cover, MD;  Location: [facility];  Service: Orthopedics;  Laterality: Right;  . POLYPECTOMY    . TOTAL HIP ARTHROPLASTY Right 07/13/2019   Procedure: RIGHT TOTAL HIP ARTHROPLASTY ANTERIOR APPROACH;  Surgeon: Mcarthur Rossetti, MD;  Location: [facility];  Service: Orthopedics;  Laterality: Right;   Social History   Occupational History  . Occupation: Environmental education officer, travels    Fish farm manager: [employer].  Tobacco Use  . Smoking status: Never Smoker  . Smokeless tobacco: Never Used  Substance and Sexual Activity  . Alcohol use: Yes    Comment: occasionally  . Drug use: No  . Sexual activity: Not on file

## 2020-01-08 DIAGNOSIS — G4733 Obstructive sleep apnea (adult) (pediatric): Secondary | ICD-10-CM | POA: Diagnosis not present

## 2020-02-03 DIAGNOSIS — G4733 Obstructive sleep apnea (adult) (pediatric): Secondary | ICD-10-CM | POA: Diagnosis not present

## 2020-02-22 ENCOUNTER — Other Ambulatory Visit: Payer: Self-pay | Admitting: Family Medicine

## 2020-04-16 NOTE — Telephone Encounter (Signed)
CY - please advise on sleep study results. Thanks.

## 2020-04-16 NOTE — Telephone Encounter (Signed)
Home sleep test without CPAP still shows severe sleep apnea, averaging 37.5 apneas/ hour with drops in blood oxygen level. I recommend he continue using his CPAP, currently set on autopap 5-15

## 2020-05-20 ENCOUNTER — Other Ambulatory Visit: Payer: Self-pay | Admitting: Family Medicine

## 2020-05-20 NOTE — Telephone Encounter (Signed)
OK to refill? Not on his current med list

## 2020-05-21 DIAGNOSIS — L821 Other seborrheic keratosis: Secondary | ICD-10-CM | POA: Diagnosis not present

## 2020-05-21 DIAGNOSIS — L814 Other melanin hyperpigmentation: Secondary | ICD-10-CM | POA: Diagnosis not present

## 2020-05-21 DIAGNOSIS — Z85828 Personal history of other malignant neoplasm of skin: Secondary | ICD-10-CM | POA: Diagnosis not present

## 2020-05-21 DIAGNOSIS — L219 Seborrheic dermatitis, unspecified: Secondary | ICD-10-CM | POA: Diagnosis not present

## 2020-08-26 ENCOUNTER — Other Ambulatory Visit: Payer: Self-pay | Admitting: Family Medicine

## 2020-09-18 ENCOUNTER — Encounter: Payer: Self-pay | Admitting: Family Medicine

## 2020-09-18 ENCOUNTER — Encounter: Payer: BC Managed Care – PPO | Admitting: Family Medicine

## 2020-09-18 ENCOUNTER — Ambulatory Visit (INDEPENDENT_AMBULATORY_CARE_PROVIDER_SITE_OTHER): Payer: BC Managed Care – PPO | Admitting: Family Medicine

## 2020-09-18 ENCOUNTER — Other Ambulatory Visit: Payer: Self-pay

## 2020-09-18 VITALS — BP 134/80 | HR 77 | Temp 97.4°F | Resp 17 | Ht 71.0 in | Wt 209.2 lb

## 2020-09-18 DIAGNOSIS — K219 Gastro-esophageal reflux disease without esophagitis: Secondary | ICD-10-CM

## 2020-09-18 DIAGNOSIS — Z7185 Encounter for immunization safety counseling: Secondary | ICD-10-CM | POA: Diagnosis not present

## 2020-09-18 DIAGNOSIS — Z9989 Dependence on other enabling machines and devices: Secondary | ICD-10-CM

## 2020-09-18 DIAGNOSIS — Z85828 Personal history of other malignant neoplasm of skin: Secondary | ICD-10-CM

## 2020-09-18 DIAGNOSIS — L989 Disorder of the skin and subcutaneous tissue, unspecified: Secondary | ICD-10-CM

## 2020-09-18 DIAGNOSIS — D369 Benign neoplasm, unspecified site: Secondary | ICD-10-CM

## 2020-09-18 DIAGNOSIS — M20001 Unspecified deformity of right finger(s): Secondary | ICD-10-CM | POA: Diagnosis not present

## 2020-09-18 DIAGNOSIS — Z0000 Encounter for general adult medical examination without abnormal findings: Secondary | ICD-10-CM | POA: Diagnosis not present

## 2020-09-18 DIAGNOSIS — Z79899 Other long term (current) drug therapy: Secondary | ICD-10-CM | POA: Insufficient documentation

## 2020-09-18 DIAGNOSIS — G4733 Obstructive sleep apnea (adult) (pediatric): Secondary | ICD-10-CM | POA: Diagnosis not present

## 2020-09-18 DIAGNOSIS — B351 Tinea unguium: Secondary | ICD-10-CM

## 2020-09-18 LAB — LIPID PANEL
Cholesterol: 237 mg/dL — ABNORMAL HIGH (ref 0–200)
HDL: 42.7 mg/dL (ref >39.00)
LDL Cholesterol: 155 mg/dL — ABNORMAL HIGH (ref 0–99)
NonHDL: 193.94
Total CHOL/HDL Ratio: 6
Triglycerides: 197 mg/dL — ABNORMAL HIGH (ref 0.0–149.0)
VLDL: 39.4 mg/dL (ref 0.0–40.0)

## 2020-09-18 LAB — CBC WITH DIFFERENTIAL/PLATELET
Basophils Absolute: 0.1 10*3/uL (ref 0.0–0.1)
Basophils Relative: 1.6 % (ref 0.0–3.0)
Eosinophils Absolute: 0.2 10*3/uL (ref 0.0–0.7)
Eosinophils Relative: 3.3 % (ref 0.0–5.0)
HCT: 42.3 % (ref 39.0–52.0)
Hemoglobin: 15.2 g/dL (ref 13.0–17.0)
Lymphocytes Relative: 31.9 % (ref 12.0–46.0)
Lymphs Abs: 1.8 10*3/uL (ref 0.7–4.0)
MCHC: 35.8 g/dL (ref 30.0–36.0)
MCV: 91.3 fl (ref 78.0–100.0)
Monocytes Absolute: 0.5 10*3/uL (ref 0.1–1.0)
Monocytes Relative: 9.5 % (ref 3.0–12.0)
Neutro Abs: 3 10*3/uL (ref 1.4–7.7)
Neutrophils Relative %: 53.7 % (ref 43.0–77.0)
Platelets: 235 10*3/uL (ref 150.0–400.0)
RBC: 4.63 Mil/uL (ref 4.22–5.81)
RDW: 12 % (ref 11.5–15.5)
WBC: 5.6 10*3/uL (ref 4.0–10.5)

## 2020-09-18 LAB — COMPREHENSIVE METABOLIC PANEL
ALT: 25 U/L (ref 0–53)
AST: 18 U/L (ref 0–37)
Albumin: 4.5 g/dL (ref 3.5–5.2)
Alkaline Phosphatase: 73 U/L (ref 39–117)
BUN: 16 mg/dL (ref 6–23)
CO2: 28 mEq/L (ref 19–32)
Calcium: 9.3 mg/dL (ref 8.4–10.5)
Chloride: 104 mEq/L (ref 96–112)
Creatinine, Ser: 0.9 mg/dL (ref 0.40–1.50)
GFR: 94 mL/min (ref >60.00)
Glucose, Bld: 101 mg/dL — ABNORMAL HIGH (ref 70–99)
Potassium: 3.8 mEq/L (ref 3.5–5.1)
Sodium: 139 mEq/L (ref 135–145)
Total Bilirubin: 0.8 mg/dL (ref 0.2–1.2)
Total Protein: 6.9 g/dL (ref 6.0–8.3)

## 2020-09-18 LAB — VITAMIN B12: Vitamin B-12: 370 pg/mL (ref 211–911)

## 2020-09-18 MED ORDER — OMEPRAZOLE 20 MG PO CPDR
20.0000 mg | DELAYED_RELEASE_CAPSULE | Freq: Every day | ORAL | 3 refills | Status: DC
Start: 2020-09-18 — End: 2021-10-05

## 2020-09-18 MED ORDER — SHINGRIX 50 MCG/0.5ML IM SUSR: 0.5000 mL | Freq: Once | INTRAMUSCULAR | 0 refills | Status: AC

## 2020-09-18 NOTE — Patient Instructions (Addendum)
Please return in 12 months for your annual complete physical; please come fasting. Please see Dr. Renda Rolls for a skin biopsy or evaluation for the persistent skin lesion on your chin.  I will release your lab results to you on your MyChart account with further instructions. Please reply with any questions.   Please take the prescription for Shingrix to the pharmacy so they may administer the vaccinations. Your insurance will then cover the injections.   Decrease prilosec use if you can.  You will need to schedule your colonoscopy this year. It is due August of this year according to my records.   If you have any questions or concerns, please don't hesitate to send me a message via MyChart or call the office at [phone]. Thank you for visiting with [place] today! It's our pleasure caring for you.

## 2020-09-18 NOTE — Progress Notes (Signed)
Subjective  Chief Complaint  Patient presents with  . Annual Exam    HPI: Norman Dunn is a 60 y.o. male who presents to [facility] [facility] [place] today for a Male Wellness Visit. He also has the concerns and/or needs as listed above in the chief complaint. These will be addressed in addition to the Health Maintenance Visit.   Wellness Visit: annual visit with health maintenance review and exam    Health maintenance: Nonfasting today.  Living healthy lifestyle.  Has moved to [facility].  They are relocating, looking for a but no inventory to date.  She will eventually establish with PCP there.  Continues to eat well.  Feels well.  Eligible for Shingrix vaccination.  Other physicians are up-to-date.  Body mass index is 29.18 kg/m. Wt Readings from Last 3 Encounters:  09/18/20 209 lb 3.2 oz (94.9 kg)  11/27/19 205 lb 12.8 oz (93.4 kg)  09/17/19 203 lb (92.1 kg)     Chronic disease management visit and/or acute problem visit:  History of adenomatous polyps: Due in August for colon cancer surveillance.  Patient to call Dr. Ardis Hughs office.  No melena.  No abdominal pain.  No weight loss.  Sleep apnea continues to do well on CPAP machine.  No concerns.  GERD with history of hiatal hernia on long-term PPI.  Symptoms are well controlled.  No nausea or vomiting.  Has not tried to wean.  Complains of skin lesion on left chin.  Has been there for 1 to 2 years.  Not growing.  Not painful, however mix it with razor daily.  Would like it removed if possible.  He reports he has a history of basal cell carcinoma and had Mohs surgery on his left nose years ago.  He does see a dermatologist.  Was just here for annual skin check.  Toenail fungus: Chronic.  Has tried Penlac in the past without any resolution of symptoms.  Recently sought care in [place] and was given terbinafine.  Has questions if he can use this.  Does not bother him greatly but it does bother his wife.  No  pain. Review of systems: Positive for bulging center of his stomach when sitting up.  No pain.  Small umbilical hernia without pain or worsening.  Right fifth finger deformity.  He has had 2 hand surgeries.  After traumatic injury.  Questions whether he needs further intervention.  Not painful or bothersome but he does not like how it looks.  Patient Active Problem List   Diagnosis Date Noted  . Adenomatous polyps 05/18/2018  . OSA on CPAP 09/07/2012  . Long-term current use of proton pump inhibitor therapy 09/18/2020  . History of basal cell carcinoma 09/18/2020  . Gastroesophageal reflux disease without esophagitis 07/12/2013  . Onychomycosis 09/18/2020  . Status post total replacement of right hip 07/26/2019  . Genital herpes 05/06/2014   Health Maintenance  Topic Date Due  . COVID-19 Vaccine (3 - Booster for Pfizer series) 04/30/2020  . COLONOSCOPY (Pts 45-26yrs Insurance coverage will need to be confirmed)  02/22/2021  . TETANUS/TDAP  05/18/2028  . INFLUENZA VACCINE  Completed  . Hepatitis C Screening  Completed  . HIV Screening  Completed  . HPV VACCINES  Aged Out   Immunization History  Administered Date(s) Administered  . Influenza Inj Mdck Quad Pf 03/29/2019  . Influenza Whole 03/28/2012, 03/28/2013  . Influenza,inj,Quad PF,6+ Mos 04/20/2018, 03/29/2019  . Influenza-Unspecified 03/28/2014, 05/06/2014, 03/28/2020  . PFIZER(Purple Top)SARS-COV-2 Vaccination  10/04/2019, 10/29/2019  . Tdap 05/18/2018   We updated and reviewed the patient's past history in detail and it is documented below. Allergies: Patient has No Known Allergies. Past Medical History  has a past medical history of Adenomatous polyps (05/18/2018), GERD (gastroesophageal reflux disease), High cholesterol, OSA (obstructive sleep apnea), Sleep apnea, and Unilateral primary osteoarthritis, right hip (02/16/2018). Past Surgical History Patient  has a past surgical history that includes Inguinal hernia repair;  Colonoscopy; Polypectomy; Percutaneous pinning (Right, 06/26/2016); Esophageal dilation; Dupuytren contracture release (Right, 10/19/2016); Total hip arthroplasty (Right, 07/13/2019); and Mohs surgery. Social History Patient  reports that he has never smoked. He has never used smokeless tobacco. He reports current alcohol use. He reports that he does not use drugs. Family History family history includes Leukemia (age of onset: 2) in his son; Prostate cancer in his father. Review of Systems: Constitutional: negative for fever or malaise Ophthalmic: negative for photophobia, double vision or loss of vision Cardiovascular: negative for chest pain, dyspnea on exertion, or new LE swelling Respiratory: negative for SOB or persistent cough Gastrointestinal: negative for abdominal pain, change in bowel habits or melena Genitourinary: negative for dysuria or gross hematuria Musculoskeletal: negative for new gait disturbance or muscular weakness Integumentary: negative for new or persistent rashes Neurological: negative for TIA or stroke symptoms Psychiatric: negative for SI or delusions Allergic/Immunologic: negative for hives  Patient Care Team    Relationship Specialty Notifications Start End  Leamon Arnt, MD PCP - General Family Medicine  05/18/18   Milus Banister, MD Attending Physician Gastroenterology  05/18/18   Otelia Sergeant, OD Referring Physician Optometry  05/18/18   Mcarthur Rossetti, MD Consulting Physician Orthopedic Surgery  09/17/19   Devra Dopp, MD Referring Physician Dermatology  09/18/20    Objective  Vitals: BP 134/80   Pulse 77   Temp (!) 97.4 F (36.3 C) (Temporal)   Resp 17   Ht 5\' 11"  (1.803 m)   Wt 209 lb 3.2 oz (94.9 kg)   SpO2 99%   BMI 29.18 kg/m  General:  Well developed, well nourished, no acute distress  Psych:  Alert and orientedx3,normal mood and affect, happy HEENT:  Normocephalic, atraumatic, non-icteric sclera, PERRL, oropharynx  is clear without mass or exudate, supple neck without adenopathy, mass or thyromegaly Cardiovascular:  Normal S1, S2, RRR without gallop, rub or murmur, nondisplaced PMI, +2 distal pulses in bilateral upper and lower extremities. Respiratory:  Good breath sounds bilaterally, CTAB with normal respiratory effort Gastrointestinal: normal bowel sounds, soft, non-tender, no noted masses. No HSM, diastasis recti present, small reducible umbilical hernia, nontender MSK: Right fifth finger with PIP and DIP deformities, no contusions. Joints are without erythema or swelling. Spine and CVA region are nontender Skin:  Warm, no rashes or suspicious lesions noted Neurologic:    Mental status is normal. CN 2-11 are normal. Gross motor and sensory exams are normal. Stable gait. No tremor    Assessment  1. Annual physical exam   2. Adenomatous polyps   3. OSA on CPAP   4. Gastroesophageal reflux disease without esophagitis   5. Long-term current use of proton pump inhibitor therapy   6. Skin lesion of face   7. Onychomycosis   8. History of basal cell carcinoma   9. Vaccine counseling   10. Finger deformity, acquired, right      Plan  Male Wellness Visit:  Age appropriate Health Maintenance and Prevention measures were discussed with patient. Included topics are cancer screening recommendations,  ways to keep healthy (see AVS) including dietary and exercise recommendations, regular eye and dental care, use of seat belts, and avoidance of moderate alcohol use and tobacco use.   BMI: discussed patient's BMI and encouraged positive lifestyle modifications to help get to or maintain a target BMI.  HM needs and immunizations were addressed and ordered. See below for orders. See HM and immunization section for updates.  Counseled on Shingrix vaccination.  Prescription given.  Patient defers today and will get at pharmacy since he lives 2 hours away.  Routine labs and screening tests ordered including cmp,  cbc and lipids where appropriate.  Discussed recommendations regarding Vit D and calcium supplementation (see AVS)  Chronic disease f/u and/or acute problem visit: (deemed necessary to be done in addition to the wellness visit):  Adenomatous polyps: Recommend scheduling colonoscopy with [facility].  Continue CPAP for sleep apnea: Clinically well controlled.  Chronic PPI for GERD therapy: Discussed ways to try to wean medication if able.  Check vitamin B12 levels.  Well-controlled.  Skin lesion of face: Question small basal cell carcinoma.  Recommend biopsy.  Patient to see dermatology.  Onychomycosis: Mild, terbinafine for 1 to 3 months discussed.  Patient has prescription.  Finger deformity: History of contracture deformities tobacco a.  Since not painful he can leave it alone.  Recommend he consider referral or consultation if he would like another opinion.  Follow up: 12 months for your complete annual physical exam with blood work. Please come fasting.  Commons side effects, risks, benefits, and alternatives for medications and treatment plan prescribed today were discussed, and the patient expressed understanding of the given instructions. Patient is instructed to call or message via MyChart if he/she has any questions or concerns regarding our treatment plan. No barriers to understanding were identified. We discussed Red Flag symptoms and signs in detail. Patient expressed understanding regarding what to do in case of urgent or emergency type symptoms.   Medication list was reconciled, printed and provided to the patient in AVS. Patient instructions and summary information was reviewed with the patient as documented in the AVS. This note was prepared with assistance of Dragon voice recognition software. Occasional wrong-word or sound-a-like substitutions may have occurred due to the inherent limitations of voice recognition software  This visit occurred during the SARS-CoV-2 public  health emergency.  Safety protocols were in place, including screening questions prior to the visit, additional usage of staff PPE, and extensive cleaning of exam room while observing appropriate contact time as indicated for disinfecting solutions.   Orders Placed This Encounter  Procedures  . CBC with Differential/Platelet  . Comprehensive metabolic panel  . Lipid panel  . Vitamin B12   Meds ordered this encounter  Medications  . omeprazole (PRILOSEC) 20 MG capsule    Sig: Take 1 capsule (20 mg total) by mouth daily.    Dispense:  90 capsule    Refill:  3  . Zoster Vaccine Adjuvanted [facility]) injection    Sig: Inject 0.5 mLs into the muscle once for 1 dose. Please give 2nd dose 2-6 months after first dose    Dispense:  2 each    Refill:  0

## 2020-12-24 IMAGING — DX DG PORTABLE PELVIS
1 series · 1 of 1 positions shown · non-contrast
Comparison: None

CLINICAL DATA: Postoperative anterior approach right hip
replacement, history of osteoarthritis

EXAM:
PORTABLE PELVIS 1-2 VIEWS

[pelvis ap]
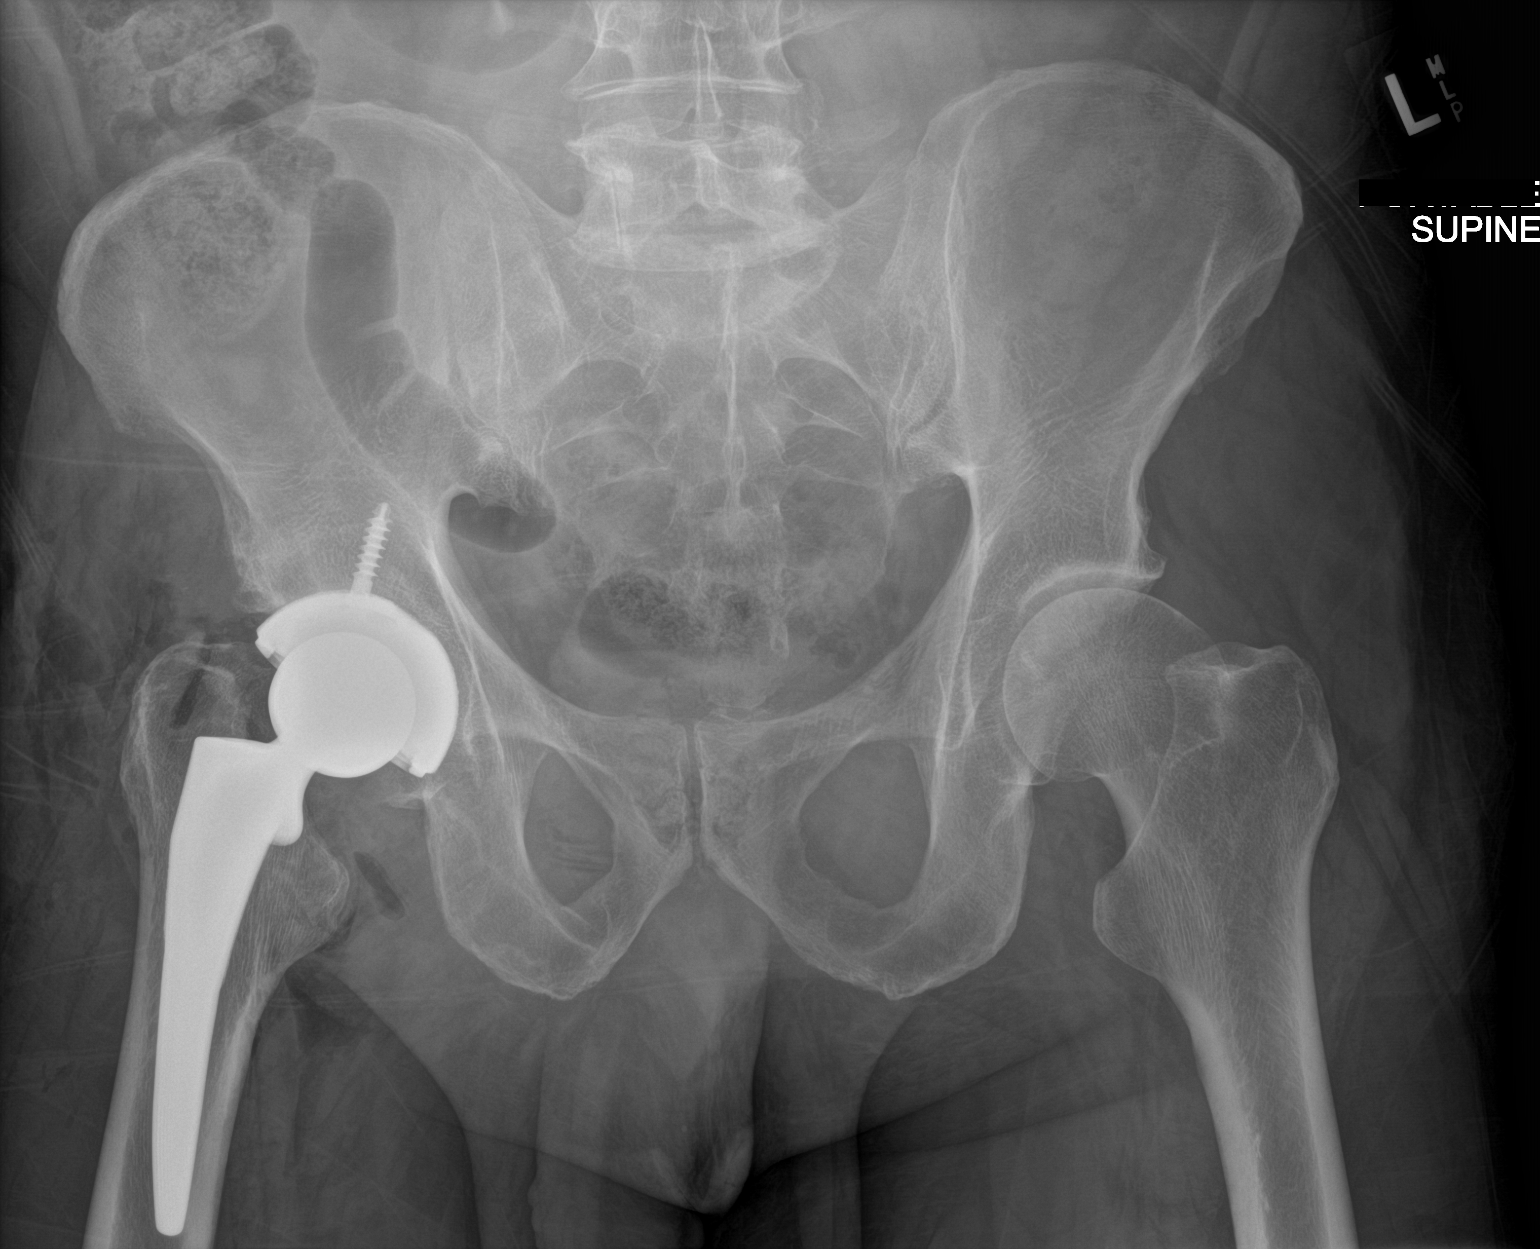

[1 of 1 positions shown; findings below may reference images not displayed]

FINDINGS: AP view of the pelvis with changes of right hip arthroplasty, gas in
the soft tissues overlying the surgical site, expected finding
following recent surgery. No signs of hardware related complication.
No signs of fracture on single view.
IMPRESSION: Immediate postoperative view of the pelvis shows right total hip
arthroplasty without complicating features.

## 2021-03-02 ENCOUNTER — Encounter: Payer: Self-pay | Admitting: Gastroenterology

## 2021-09-30 ENCOUNTER — Other Ambulatory Visit: Payer: Self-pay | Admitting: Family Medicine

## 2021-10-29 ENCOUNTER — Other Ambulatory Visit: Payer: Self-pay | Admitting: Family Medicine

## 2021-11-10 ENCOUNTER — Encounter: Payer: Self-pay | Admitting: Family Medicine

## 2021-11-10 ENCOUNTER — Ambulatory Visit: Payer: BC Managed Care – PPO | Admitting: Family Medicine

## 2021-11-10 VITALS — BP 138/86 | HR 62 | Temp 97.6°F | Ht 71.0 in | Wt 210.6 lb

## 2021-11-10 DIAGNOSIS — K219 Gastro-esophageal reflux disease without esophagitis: Secondary | ICD-10-CM | POA: Diagnosis not present

## 2021-11-10 DIAGNOSIS — E785 Hyperlipidemia, unspecified: Secondary | ICD-10-CM

## 2021-11-10 DIAGNOSIS — R739 Hyperglycemia, unspecified: Secondary | ICD-10-CM | POA: Diagnosis not present

## 2021-11-10 MED ORDER — OMEPRAZOLE 20 MG PO CPDR: DELAYED_RELEASE_CAPSULE | ORAL | 3 refills | Status: AC

## 2021-11-10 NOTE — Patient Instructions (Signed)
It was very nice to see you today! ? ?We will refill your reflux medication today. ? ?Keep an eye on your blood pressure and let us know if persistently elevated 140/90 or higher. ? ?Please come back to see Dr. Jonni Sanger soon for your annual physical. ? ?Take care, ?Dr Jerline Pain ? ?PLEASE NOTE: ? ?If you had any lab tests please let us know if you have not heard back within a few days. You may see your results on mychart before we have a chance to review them but we will give you a call once they are reviewed by [place]. If we ordered any referrals today, please let us know if you have not heard from their office within the next week.  ? ?Please try these tips to maintain a healthy lifestyle: ? ?Eat at least 3 REAL meals and 1-2 snacks per day.  Aim for no more than 5 hours between eating.  If you eat breakfast, please do so within one hour of getting up.  ? ?Each meal should contain half fruits/vegetables, one quarter protein, and one quarter carbs (no bigger than a computer mouse) ? ?Cut down on sweet beverages. This includes juice, soda, and sweet tea.  ? ?Drink at least 1 glass of water with each meal and aim for at least 8 glasses per day ? ?Exercise at least 150 minutes every week.   ?

## 2021-11-10 NOTE — Progress Notes (Signed)
? ?  Norman Dunn is a 61 y.o. male who presents today for an office visit. ? ?Assessment/Plan:  ?Chronic Problems Addressed Today: ?GERD ?Stable on omeprazole 20 mg daily.  Will refill today.  Advised he need to come back to see his PCP soon. ? ?Hyperglycemia ?Last glucose 101 on c-Met.  He asked about weight loss medications including Ozempic. We briefly discussed GLP-1 agonist.  Recommended he come back to see his PCP soon and check an A1c before starting any medication.  He is agreeable. ? ?Dyslipidemia ?He will come back soon for CPE and he can recheck lipids at that time.  ? ? ?  ?Subjective:  ?HPI: ? ?See A/P for status of chronic conditions.  He needs refill on omeprazole.  Was last seen by his PCP over a year ago.  Medication works well.  No significant side effects. ? ?   ?  ?Objective:  ?Physical Exam: ?BP (!) 142/89   Pulse 62   Temp 97.6 ?F (36.4 ?C) (Temporal)   Ht _0  (1.803 m)   Wt 210 lb 9.6 oz (95.5 kg)   SpO2 97%   BMI 29.37 kg/m?   ?Gen: No acute distress, resting comfortably ?Neuro: Grossly normal, moves all extremities ?Psych: Normal affect and thought content ? ?   ? ?Norman Dunn. Norman Pain, MD ?11/10/2021 11:31 AM  ?

## 2022-03-22 ENCOUNTER — Encounter: Payer: Self-pay | Admitting: *Deleted

## 2022-06-10 ENCOUNTER — Encounter: Payer: Self-pay | Admitting: *Deleted
